# Patient Record
Sex: Male | Born: 1990 | Race: Black or African American | Hispanic: No | Marital: Single | Smoking: Current every day smoker
Health system: Southern US, Community
[De-identification: ages and names within clinical notes are randomized; demographics above are authoritative.]

## PROBLEM LIST (undated history)

## (undated) DIAGNOSIS — I10 Essential (primary) hypertension: Secondary | ICD-10-CM

## (undated) DIAGNOSIS — W3400XA Accidental discharge from unspecified firearms or gun, initial encounter: Secondary | ICD-10-CM

## (undated) DIAGNOSIS — R569 Unspecified convulsions: Principal | ICD-10-CM

## (undated) DIAGNOSIS — H547 Unspecified visual loss: Secondary | ICD-10-CM

## (undated) HISTORY — PX: ENUCLEATION: SHX628

---
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
## 2010-12-25 ENCOUNTER — Emergency Department (HOSPITAL_COMMUNITY)
Admission: EM | Admit: 2010-12-25 | Discharge: 2010-12-25 | Disposition: A | Discharge status: Home / Self Care | Payer: Self-pay | Attending: Emergency Medicine | Admitting: Emergency Medicine

## 2010-12-25 DIAGNOSIS — R300 Dysuria: Secondary | ICD-10-CM | POA: Insufficient documentation

## 2010-12-25 DIAGNOSIS — R109 Unspecified abdominal pain: Secondary | ICD-10-CM | POA: Insufficient documentation

## 2010-12-25 DIAGNOSIS — N390 Urinary tract infection, site not specified: Secondary | ICD-10-CM | POA: Insufficient documentation

## 2010-12-25 DIAGNOSIS — R369 Urethral discharge, unspecified: Secondary | ICD-10-CM | POA: Insufficient documentation

## 2010-12-25 LAB — URINALYSIS, ROUTINE W REFLEX MICROSCOPIC
Bilirubin Urine: NEGATIVE
Ketones, ur: NEGATIVE mg/dL
Nitrite: NEGATIVE
Protein, ur: 30 mg/dL — AB
Specific Gravity, Urine: 1.021 (ref 1.005–1.030)
Urine Glucose, Fasting: NEGATIVE mg/dL
Urobilinogen, UA: 1 mg/dL (ref 0.0–1.0)
pH: 6 (ref 5.0–8.0)

## 2010-12-25 LAB — URINE MICROSCOPIC-ADD ON

## 2010-12-28 LAB — GC/CHLAMYDIA PROBE AMP, GENITAL
Chlamydia, DNA Probe: NEGATIVE
GC Probe Amp, Genital: POSITIVE — AB

## 2011-02-28 LAB — MONONUCLEOSIS SCREEN: Mono Screen: POSITIVE — AB

## 2011-02-28 LAB — RAPID STREP SCREEN (MED CTR MEBANE ONLY): Streptococcus, Group A Screen (Direct): NEGATIVE

## 2011-02-28 LAB — STREP A DNA PROBE: Group A Strep Probe: NEGATIVE

## 2016-10-26 ENCOUNTER — Emergency Department (HOSPITAL_COMMUNITY)
Admission: EM | Admit: 2016-10-26 | Discharge: 2016-10-26 | Disposition: A | Discharge status: Other Acute Inpatient Hospital | Payer: Medicaid Other | Attending: Emergency Medicine | Admitting: Emergency Medicine

## 2016-10-26 ENCOUNTER — Encounter (HOSPITAL_COMMUNITY): Payer: Self-pay | Admitting: Emergency Medicine

## 2016-10-26 ENCOUNTER — Emergency Department (HOSPITAL_COMMUNITY): Payer: Medicaid Other

## 2016-10-26 DIAGNOSIS — S0193XA Puncture wound without foreign body of unspecified part of head, initial encounter: Secondary | ICD-10-CM

## 2016-10-26 DIAGNOSIS — Z5181 Encounter for therapeutic drug level monitoring: Secondary | ICD-10-CM | POA: Insufficient documentation

## 2016-10-26 DIAGNOSIS — S0993XA Unspecified injury of face, initial encounter: Secondary | ICD-10-CM | POA: Diagnosis not present

## 2016-10-26 DIAGNOSIS — W3400XA Accidental discharge from unspecified firearms or gun, initial encounter: Secondary | ICD-10-CM | POA: Insufficient documentation

## 2016-10-26 DIAGNOSIS — Y999 Unspecified external cause status: Secondary | ICD-10-CM | POA: Insufficient documentation

## 2016-10-26 DIAGNOSIS — S0180XA Unspecified open wound of other part of head, initial encounter: Secondary | ICD-10-CM | POA: Diagnosis present

## 2016-10-26 DIAGNOSIS — Y929 Unspecified place or not applicable: Secondary | ICD-10-CM | POA: Insufficient documentation

## 2016-10-26 DIAGNOSIS — Z79899 Other long term (current) drug therapy: Secondary | ICD-10-CM | POA: Insufficient documentation

## 2016-10-26 DIAGNOSIS — S0990XA Unspecified injury of head, initial encounter: Secondary | ICD-10-CM | POA: Diagnosis not present

## 2016-10-26 DIAGNOSIS — S0591XA Unspecified injury of right eye and orbit, initial encounter: Secondary | ICD-10-CM | POA: Insufficient documentation

## 2016-10-26 DIAGNOSIS — Y939 Activity, unspecified: Secondary | ICD-10-CM | POA: Insufficient documentation

## 2016-10-26 DIAGNOSIS — S0590XA Unspecified injury of unspecified eye and orbit, initial encounter: Secondary | ICD-10-CM

## 2016-10-26 LAB — PREPARE FRESH FROZEN PLASMA
UNIT DIVISION: 0
Unit division: 0

## 2016-10-26 LAB — COMPREHENSIVE METABOLIC PANEL
ALT: 19 U/L (ref 17–63)
ANION GAP: 10 (ref 5–15)
AST: 26 U/L (ref 15–41)
Albumin: 4.3 g/dL (ref 3.5–5.0)
Alkaline Phosphatase: 41 U/L (ref 38–126)
BUN: 13 mg/dL (ref 6–20)
CALCIUM: 9.1 mg/dL (ref 8.9–10.3)
CHLORIDE: 105 mmol/L (ref 101–111)
CO2: 23 mmol/L (ref 22–32)
Creatinine, Ser: 0.95 mg/dL (ref 0.61–1.24)
GFR, EST AFRICAN AMERICAN: 57 mL/min — AB (ref >60)
GFR, EST NON AFRICAN AMERICAN: 49 mL/min — AB (ref >60)
Glucose, Bld: 144 mg/dL — ABNORMAL HIGH (ref 65–99)
Potassium: 3.2 mmol/L — ABNORMAL LOW (ref 3.5–5.1)
SODIUM: 138 mmol/L (ref 135–145)
Total Bilirubin: 0.6 mg/dL (ref 0.3–1.2)
Total Protein: 7.4 g/dL (ref 6.5–8.1)

## 2016-10-26 LAB — CBC
HCT: 44.5 % (ref 39.0–52.0)
HEMOGLOBIN: 15.6 g/dL (ref 13.0–17.0)
MCH: 30.7 pg (ref 26.0–34.0)
MCHC: 35.1 g/dL (ref 30.0–36.0)
MCV: 87.6 fL (ref 78.0–100.0)
Platelets: 231 10*3/uL (ref 150–400)
RBC: 5.08 MIL/uL (ref 4.22–5.81)
RDW: 12.5 % (ref 11.5–15.5)
WBC: 8.7 10*3/uL (ref 4.0–10.5)

## 2016-10-26 LAB — I-STAT ARTERIAL BLOOD GAS, ED
Bicarbonate: 25.5 mmol/L (ref 20.0–28.0)
O2 Saturation: 100 %
PCO2 ART: 44.3 mmHg (ref 32.0–48.0)
PH ART: 7.369 (ref 7.350–7.450)
Patient temperature: 98.6
TCO2: 27 mmol/L (ref 0–100)
pO2, Arterial: 516 mmHg — ABNORMAL HIGH (ref 83.0–108.0)

## 2016-10-26 LAB — I-STAT CHEM 8, ED
BUN: 16 mg/dL (ref 6–20)
CALCIUM ION: 1.13 mmol/L — AB (ref 1.15–1.40)
Chloride: 104 mmol/L (ref 101–111)
Creatinine, Ser: 0.9 mg/dL (ref 0.61–1.24)
Glucose, Bld: 143 mg/dL — ABNORMAL HIGH (ref 65–99)
HEMATOCRIT: 45 % (ref 39.0–52.0)
Hemoglobin: 15.3 g/dL (ref 13.0–17.0)
Potassium: 3.3 mmol/L — ABNORMAL LOW (ref 3.5–5.1)
SODIUM: 141 mmol/L (ref 135–145)
TCO2: 25 mmol/L (ref 0–100)

## 2016-10-26 LAB — PROTIME-INR
INR: 1.15
Prothrombin Time: 14.8 seconds (ref 11.4–15.2)

## 2016-10-26 LAB — ETHANOL

## 2016-10-26 LAB — CDS SEROLOGY

## 2016-10-26 LAB — ABO/RH: ABO/RH(D): B POS

## 2016-10-26 MED ORDER — FENTANYL CITRATE (PF) 100 MCG/2ML IJ SOLN
INTRAMUSCULAR | Status: AC
  Filled 2016-10-26: qty 2

## 2016-10-26 MED ORDER — FENTANYL 2500MCG IN NS 250ML (10MCG/ML) PREMIX INFUSION: 100.0000 ug/h | INTRAVENOUS | Status: DC

## 2016-10-26 MED ORDER — VECURONIUM BROMIDE 10 MG IV SOLR
INTRAVENOUS | Status: AC | PRN
  Administered 2016-10-26 (×2): 10 mg via INTRAVENOUS

## 2016-10-26 MED ORDER — CEFAZOLIN SODIUM-DEXTROSE 2-4 GM/100ML-% IV SOLN
INTRAVENOUS | Status: AC
  Administered 2016-10-26: 14:00:00
  Filled 2016-10-26: qty 100

## 2016-10-26 MED ORDER — PROPOFOL 1000 MG/100ML IV EMUL
INTRAVENOUS | Status: AC | PRN
  Administered 2016-10-26: 40 ug/kg/min via INTRAVENOUS

## 2016-10-26 MED ORDER — STERILE WATER FOR INJECTION IJ SOLN
INTRAMUSCULAR | Status: AC
  Filled 2016-10-26: qty 10

## 2016-10-26 MED ORDER — PROPOFOL 10 MG/ML IV BOLUS
INTRAVENOUS | Status: AC | PRN
  Administered 2016-10-26: 60 mg via INTRAVENOUS

## 2016-10-26 MED ORDER — SUCCINYLCHOLINE CHLORIDE 20 MG/ML IJ SOLN
INTRAMUSCULAR | Status: AC | PRN
  Administered 2016-10-26: 100 mg via INTRAVENOUS

## 2016-10-26 MED ORDER — PROPOFOL 1000 MG/100ML IV EMUL
INTRAVENOUS | Status: AC
  Filled 2016-10-26: qty 100

## 2016-10-26 MED ORDER — ETOMIDATE 2 MG/ML IV SOLN
INTRAVENOUS | Status: AC | PRN
  Administered 2016-10-26: 20 mg via INTRAVENOUS

## 2016-10-26 MED ORDER — FENTANYL CITRATE (PF) 100 MCG/2ML IJ SOLN
INTRAMUSCULAR | Status: AC | PRN
  Administered 2016-10-26 (×2): 100 ug via INTRAVENOUS

## 2016-10-26 MED ORDER — IOPAMIDOL (ISOVUE-370) INJECTION 76%
INTRAVENOUS | Status: AC
  Administered 2016-10-26: 50 mL
  Filled 2016-10-26: qty 50

## 2016-10-26 MED ORDER — VECURONIUM BROMIDE 10 MG IV SOLR
INTRAVENOUS | Status: AC
  Filled 2016-10-26: qty 10

## 2016-10-26 MED ORDER — TETANUS-DIPHTH-ACELL PERTUSSIS 5-2.5-18.5 LF-MCG/0.5 IM SUSP
INTRAMUSCULAR | Status: AC
  Administered 2016-10-26: 0.5 mL via INTRAMUSCULAR
  Filled 2016-10-26: qty 0.5

## 2016-10-26 NOTE — ED Notes (Signed)
Family at bedside. 
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
## 2016-10-26 NOTE — Progress Notes (Signed)
Pt. Was transported to CT & back to trauma B without any complications.  
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
## 2016-10-26 NOTE — ED Notes (Signed)
Report given to Sudie BaileyKaren [facility], [facility] at bedside to transport
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
## 2016-10-26 NOTE — ED Notes (Signed)
Pt intubated with a 7.5 et tube good color change , x ray done at bedside to confirm
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
## 2016-10-27 DIAGNOSIS — S020XXA Fracture of vault of skull, initial encounter for closed fracture: Secondary | ICD-10-CM | POA: Insufficient documentation

## 2016-10-27 DIAGNOSIS — S41111A Laceration without foreign body of right upper arm, initial encounter: Secondary | ICD-10-CM | POA: Insufficient documentation

## 2016-10-27 DIAGNOSIS — S065X9A Traumatic subdural hemorrhage with loss of consciousness of unspecified duration, initial encounter: Secondary | ICD-10-CM | POA: Insufficient documentation

## 2016-10-27 DIAGNOSIS — S0531XA Ocular laceration without prolapse or loss of intraocular tissue, right eye, initial encounter: Secondary | ICD-10-CM | POA: Insufficient documentation

## 2016-10-27 LAB — TYPE AND SCREEN
BLOOD PRODUCT EXPIRATION DATE: 201712112359
BLOOD PRODUCT EXPIRATION DATE: 201712182359
Blood Product Expiration Date: 201712112359
Blood Product Expiration Date: 201712142359
Blood Product Expiration Date: 201712182359
Blood Product Expiration Date: 201712202359
ISSUE DATE / TIME: 201712071112
ISSUE DATE / TIME: 201712071112
UNIT TYPE AND RH: 7300
UNIT TYPE AND RH: 7300
UNIT TYPE AND RH: 7300
UNIT TYPE AND RH: 9500
Unit Type and Rh: 1700
Unit Type and Rh: 9500
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
## 2017-07-25 ENCOUNTER — Telehealth: Payer: Self-pay | Admitting: Nutrition

## 2017-07-30 NOTE — Telephone Encounter (Signed)
opended in error
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
## 2021-05-27 DIAGNOSIS — H540X55 Blindness right eye category 5, blindness left eye category 5: Secondary | ICD-10-CM | POA: Diagnosis not present

## 2021-05-27 DIAGNOSIS — H44522 Atrophy of globe, left eye: Secondary | ICD-10-CM | POA: Diagnosis not present

## 2021-05-27 DIAGNOSIS — Z9001 Acquired absence of eye: Secondary | ICD-10-CM | POA: Diagnosis not present

## 2022-01-05 DIAGNOSIS — H00021 Hordeolum internum right upper eyelid: Secondary | ICD-10-CM | POA: Diagnosis not present

## 2022-07-17 ENCOUNTER — Ambulatory Visit
Admission: EM | Admit: 2022-07-17 | Discharge: 2022-07-17 | Disposition: A | Discharge status: Home / Self Care | Payer: Medicaid Other | Attending: Physician Assistant | Admitting: Physician Assistant

## 2022-07-17 DIAGNOSIS — H6122 Impacted cerumen, left ear: Secondary | ICD-10-CM | POA: Diagnosis not present

## 2022-07-17 HISTORY — DX: Essential (primary) hypertension: I10

## 2022-07-17 MED ORDER — CARBAMIDE PEROXIDE 6.5 % OT SOLN: 5.0000 [drp] | Freq: Two times a day (BID) | OTIC | 0 refills | Status: AC

## 2022-07-17 NOTE — ED Triage Notes (Signed)
Pt c/o left ear feeling "cloudy." Onset last night. Denies pain.

## 2022-07-17 NOTE — ED Provider Notes (Signed)
[facility]    CSN: [id number] Arrival date & time: 07/17/22  6767      History   Chief Complaint Chief Complaint  Patient presents with   ear feels cloudy    HPI Gary Watson is a 31 y.o. male.   Patient here today for evaluation of left-sided ear congestion that started recently.  He reports that he tried to use a Q-tip this morning to try to clean ear and states this made symptoms worse.  He notes he did fly recently and is not sure if this is related.  He denies any fever.  He has not had any nasal congestion or sore throat.  The history is provided by the patient.    Past Medical History:  Diagnosis Date   Hypertension     There are no problems to display for this patient.   History reviewed. No pertinent surgical history.     Home Medications    Prior to Admission medications   Medication Sig Start Date End Date Taking? Authorizing Provider  carbamide peroxide (DEBROX) 6.5 % OTIC solution Place 5 drops into the left ear 2 (two) times daily for 5 days. 07/17/22 07/22/22 Yes Tomi Bamberger, PA-C    Family History History reviewed. No pertinent family history.  Social History Social History   Tobacco Use   Smoking status: Unknown     Allergies   Patient has no known allergies.   Review of Systems Review of Systems  Constitutional:  Negative for chills and fever.  HENT:  Positive for ear pain. Negative for congestion and sore throat.   Eyes:  Negative for discharge and redness.  Respiratory:  Negative for cough.   Gastrointestinal:  Negative for nausea and vomiting.  Neurological:  Negative for numbness.     Physical Exam Triage Vital Signs ED Triage Vitals [07/17/22 0832]  Enc Vitals Group     BP (!) 160/115     Pulse Rate 74     Resp 18     Temp 97.8 F (36.6 C)     Temp Source Oral     SpO2 97 %     Weight      Height      Head Circumference      Peak Flow      Pain Score 0     Pain Loc      Pain Edu?       Excl. in GC?    No data found.  Updated Vital Signs BP (!) 156/106   Pulse 74   Temp 97.8 F (36.6 C) (Oral)   Resp 18   SpO2 97%      Physical Exam Vitals and nursing note reviewed.  Constitutional:      General: He is not in acute distress.    Appearance: Normal appearance. He is not ill-appearing.  HENT:     Head: Normocephalic and atraumatic.     Ears:     Comments: Small amount of cerumen in right EAC, partially viewed TM WNL, Left EAC with cerumen impaction, TM not visualized    Nose: Nose normal. No congestion or rhinorrhea.  Eyes:     Conjunctiva/sclera: Conjunctivae normal.  Cardiovascular:     Rate and Rhythm: Normal rate.  Pulmonary:     Effort: Pulmonary effort is normal.  Neurological:     Mental Status: He is alert.  Psychiatric:        Mood and Affect: Mood normal.  Behavior: Behavior normal.        Thought Content: Thought content normal.      UC Treatments / Results  Labs (all labs ordered are listed, but only abnormal results are displayed) Labs Reviewed - No data to display  EKG   Radiology No results found.  Procedures Procedures (including critical care time)  Medications Ordered in UC Medications - No data to display  Initial Impression / Assessment and Plan / UC Course  I have reviewed the triage vital signs and the nursing notes.  Pertinent labs & imaging results that were available during my care of the patient were reviewed by me and considered in my medical decision making (see chart for details).    Ear irrigation in office somewhat successful as some wax was removed from left EAC. Will prescribe debrox drops for home use and encouraged follow up if symptoms do not improve with treatment.   Final Clinical Impressions(s) / UC Diagnoses   Final diagnoses:  Impacted cerumen of left ear   Discharge Instructions   None    ED Prescriptions     Medication Sig Dispense Auth. Provider   carbamide peroxide (DEBROX)  6.5 % OTIC solution Place 5 drops into the left ear 2 (two) times daily for 5 days. 15 mL Tomi Bamberger, PA-C      PDMP not reviewed this encounter.   Tomi Bamberger, PA-C 07/17/22 [phone]

## 2022-07-28 ENCOUNTER — Ambulatory Visit
Admission: EM | Admit: 2022-07-28 | Discharge: 2022-07-28 | Disposition: A | Discharge status: Home / Self Care | Payer: Medicaid Other

## 2022-07-28 DIAGNOSIS — H6122 Impacted cerumen, left ear: Secondary | ICD-10-CM

## 2022-07-28 NOTE — ED Triage Notes (Signed)
Pt  presents to uc with co of feeling like something is moving inside L ear recently seen here last week and given ear drops. Pt reports did not help.

## 2022-07-28 NOTE — ED Provider Notes (Signed)
[facility]    CSN: [id number] Arrival date & time: 07/28/22  1140      History   Chief Complaint Chief Complaint  Patient presents with   Otalgia    HPI NGOC DAUGHTRIDGE is a 31 y.o. male.   Patient here today for evaluation of left ear discomfort. He reports today he felt as if there is something moving in his ear. He was recently seen and treated for cerumen impaction and has been using drops as prescribed. He states these have not seemed helpful. He has not had fever. He denies any issues with his right ear. He has not had any congestion or cough.   The history is provided by the patient.    Past Medical History:  Diagnosis Date   Hypertension     There are no problems to display for this patient.   No past surgical history on file.     Home Medications    Prior to Admission medications   Not on File    Family History No family history on file.  Social History Social History   Tobacco Use   Smoking status: Unknown     Allergies   Patient has no known allergies.   Review of Systems Review of Systems  Constitutional:  Negative for chills and fever.  HENT:  Negative for congestion, ear pain and sore throat.   Eyes:  Negative for discharge and redness.  Respiratory:  Negative for cough and shortness of breath.   Gastrointestinal:  Negative for abdominal pain, nausea and vomiting.     Physical Exam Triage Vital Signs ED Triage Vitals  Enc Vitals Group     BP      Pulse      Resp      Temp      Temp src      SpO2      Weight      Height      Head Circumference      Peak Flow      Pain Score      Pain Loc      Pain Edu?      Excl. in GC?    No data found.  Updated Vital Signs BP 136/88   Pulse 93   Temp 97.8 F (36.6 C)   Resp 18   SpO2 95%      Physical Exam Vitals and nursing note reviewed.  Constitutional:      General: He is not in acute distress.    Appearance: Normal appearance. He is not ill-appearing.   HENT:     Head: Normocephalic and atraumatic.     Ears:     Comments: Cerumen noted in left EAC    Nose: Nose normal. No congestion.     Mouth/Throat:     Mouth: Mucous membranes are moist.     Pharynx: Oropharynx is clear. No oropharyngeal exudate or posterior oropharyngeal erythema.  Eyes:     Conjunctiva/sclera: Conjunctivae normal.  Cardiovascular:     Rate and Rhythm: Normal rate.  Pulmonary:     Effort: Pulmonary effort is normal. No respiratory distress.  Skin:    General: Skin is warm and dry.  Neurological:     Mental Status: He is alert.  Psychiatric:        Mood and Affect: Mood normal.        Thought Content: Thought content normal.      UC Treatments / Results  Labs (all labs  ordered are listed, but only abnormal results are displayed) Labs Reviewed - No data to display  EKG   Radiology No results found.  Procedures Procedures (including critical care time)  Medications Ordered in UC Medications - No data to display  Initial Impression / Assessment and Plan / UC Course  I have reviewed the triage vital signs and the nursing notes.  Pertinent labs & imaging results that were available during my care of the patient were reviewed by me and considered in my medical decision making (see chart for details).    Left ear irrigated in office-- cerumen removed successfully. No FB noted. Encouraged follow up with any further concerns.   Final Clinical Impressions(s) / UC Diagnoses   Final diagnoses:  Impacted cerumen of left ear   Discharge Instructions   None    ED Prescriptions   None    PDMP not reviewed this encounter.   Tomi Bamberger, PA-C 07/28/22 1243

## 2022-12-08 ENCOUNTER — Inpatient Hospital Stay (HOSPITAL_COMMUNITY)
Admission: EM | Admit: 2022-12-08 | Discharge: 2022-12-22 | DRG: 100 | Discharge status: Against Medical Advice | Payer: Medicaid Other | Attending: Internal Medicine | Admitting: Internal Medicine

## 2022-12-08 ENCOUNTER — Emergency Department (HOSPITAL_COMMUNITY): Payer: Medicaid Other

## 2022-12-08 ENCOUNTER — Encounter (HOSPITAL_COMMUNITY): Payer: Self-pay | Admitting: Pulmonary Disease

## 2022-12-08 ENCOUNTER — Inpatient Hospital Stay (HOSPITAL_COMMUNITY): Payer: Medicaid Other

## 2022-12-08 DIAGNOSIS — J690 Pneumonitis due to inhalation of food and vomit: Secondary | ICD-10-CM | POA: Diagnosis present

## 2022-12-08 DIAGNOSIS — F1220 Cannabis dependence, uncomplicated: Secondary | ICD-10-CM | POA: Diagnosis present

## 2022-12-08 DIAGNOSIS — R3129 Other microscopic hematuria: Secondary | ICD-10-CM | POA: Diagnosis not present

## 2022-12-08 DIAGNOSIS — M6282 Rhabdomyolysis: Secondary | ICD-10-CM | POA: Diagnosis present

## 2022-12-08 DIAGNOSIS — Z9911 Dependence on respirator [ventilator] status: Secondary | ICD-10-CM | POA: Diagnosis not present

## 2022-12-08 DIAGNOSIS — Z6838 Body mass index (BMI) 38.0-38.9, adult: Secondary | ICD-10-CM | POA: Diagnosis not present

## 2022-12-08 DIAGNOSIS — R000 Tachycardia, unspecified: Secondary | ICD-10-CM | POA: Diagnosis not present

## 2022-12-08 DIAGNOSIS — J9601 Acute respiratory failure with hypoxia: Secondary | ICD-10-CM | POA: Diagnosis not present

## 2022-12-08 DIAGNOSIS — H548 Legal blindness, as defined in USA: Secondary | ICD-10-CM | POA: Diagnosis present

## 2022-12-08 DIAGNOSIS — E669 Obesity, unspecified: Secondary | ICD-10-CM | POA: Diagnosis present

## 2022-12-08 DIAGNOSIS — R569 Unspecified convulsions: Secondary | ICD-10-CM | POA: Diagnosis not present

## 2022-12-08 DIAGNOSIS — G9389 Other specified disorders of brain: Secondary | ICD-10-CM | POA: Diagnosis present

## 2022-12-08 DIAGNOSIS — Z5329 Procedure and treatment not carried out because of patient's decision for other reasons: Secondary | ICD-10-CM | POA: Diagnosis not present

## 2022-12-08 DIAGNOSIS — R451 Restlessness and agitation: Secondary | ICD-10-CM | POA: Diagnosis present

## 2022-12-08 DIAGNOSIS — E871 Hypo-osmolality and hyponatremia: Secondary | ICD-10-CM | POA: Diagnosis present

## 2022-12-08 DIAGNOSIS — R109 Unspecified abdominal pain: Secondary | ICD-10-CM | POA: Diagnosis not present

## 2022-12-08 DIAGNOSIS — R739 Hyperglycemia, unspecified: Secondary | ICD-10-CM | POA: Diagnosis present

## 2022-12-08 DIAGNOSIS — R34 Anuria and oliguria: Secondary | ICD-10-CM | POA: Diagnosis not present

## 2022-12-08 DIAGNOSIS — E8721 Acute metabolic acidosis: Secondary | ICD-10-CM | POA: Diagnosis present

## 2022-12-08 DIAGNOSIS — R0902 Hypoxemia: Secondary | ICD-10-CM | POA: Diagnosis not present

## 2022-12-08 DIAGNOSIS — G40401 Other generalized epilepsy and epileptic syndromes, not intractable, with status epilepticus: Secondary | ICD-10-CM | POA: Diagnosis present

## 2022-12-08 DIAGNOSIS — R410 Disorientation, unspecified: Secondary | ICD-10-CM | POA: Diagnosis not present

## 2022-12-08 DIAGNOSIS — I160 Hypertensive urgency: Secondary | ICD-10-CM | POA: Diagnosis not present

## 2022-12-08 DIAGNOSIS — N179 Acute kidney failure, unspecified: Secondary | ICD-10-CM | POA: Diagnosis present

## 2022-12-08 DIAGNOSIS — R0689 Other abnormalities of breathing: Secondary | ICD-10-CM | POA: Diagnosis not present

## 2022-12-08 DIAGNOSIS — J9602 Acute respiratory failure with hypercapnia: Secondary | ICD-10-CM | POA: Diagnosis present

## 2022-12-08 DIAGNOSIS — Z79899 Other long term (current) drug therapy: Secondary | ICD-10-CM

## 2022-12-08 DIAGNOSIS — R4182 Altered mental status, unspecified: Secondary | ICD-10-CM | POA: Diagnosis not present

## 2022-12-08 DIAGNOSIS — I10 Essential (primary) hypertension: Secondary | ICD-10-CM | POA: Diagnosis present

## 2022-12-08 DIAGNOSIS — Z7151 Drug abuse counseling and surveillance of drug abuser: Secondary | ICD-10-CM

## 2022-12-08 DIAGNOSIS — E876 Hypokalemia: Secondary | ICD-10-CM | POA: Diagnosis present

## 2022-12-08 DIAGNOSIS — J9690 Respiratory failure, unspecified, unspecified whether with hypoxia or hypercapnia: Secondary | ICD-10-CM | POA: Diagnosis not present

## 2022-12-08 DIAGNOSIS — D649 Anemia, unspecified: Secondary | ICD-10-CM | POA: Diagnosis present

## 2022-12-08 DIAGNOSIS — R404 Transient alteration of awareness: Secondary | ICD-10-CM | POA: Diagnosis not present

## 2022-12-08 DIAGNOSIS — Z4682 Encounter for fitting and adjustment of non-vascular catheter: Secondary | ICD-10-CM | POA: Diagnosis not present

## 2022-12-08 DIAGNOSIS — N289 Disorder of kidney and ureter, unspecified: Secondary | ICD-10-CM | POA: Diagnosis not present

## 2022-12-08 DIAGNOSIS — Z4901 Encounter for fitting and adjustment of extracorporeal dialysis catheter: Secondary | ICD-10-CM | POA: Diagnosis not present

## 2022-12-08 DIAGNOSIS — J9811 Atelectasis: Secondary | ICD-10-CM | POA: Diagnosis not present

## 2022-12-08 HISTORY — DX: Unspecified visual loss: H54.7

## 2022-12-08 HISTORY — DX: Unspecified convulsions: R56.9

## 2022-12-08 HISTORY — DX: Accidental discharge from unspecified firearms or gun, initial encounter: W34.00XA

## 2022-12-08 LAB — HEMOGLOBIN A1C
Hgb A1c MFr Bld: 5.3 % (ref 4.8–5.6)
Mean Plasma Glucose: 105.41 mg/dL

## 2022-12-08 LAB — COMPREHENSIVE METABOLIC PANEL
ALT: 31 U/L (ref 0–44)
AST: 36 U/L (ref 15–41)
Albumin: 4.1 g/dL (ref 3.5–5.0)
Alkaline Phosphatase: 55 U/L (ref 38–126)
Anion gap: 22 — ABNORMAL HIGH (ref 5–15)
BUN: 12 mg/dL (ref 6–20)
CO2: 10 mmol/L — ABNORMAL LOW (ref 22–32)
Calcium: 8.6 mg/dL — ABNORMAL LOW (ref 8.9–10.3)
Chloride: 109 mmol/L (ref 98–111)
Creatinine, Ser: 1.47 mg/dL — ABNORMAL HIGH (ref 0.61–1.24)
GFR, Estimated: >60 mL/min (ref >60)
Glucose, Bld: 215 mg/dL — ABNORMAL HIGH (ref 70–99)
Potassium: 3.8 mmol/L (ref 3.5–5.1)
Sodium: 141 mmol/L (ref 135–145)
Total Bilirubin: 0.4 mg/dL (ref 0.3–1.2)
Total Protein: 8 g/dL (ref 6.5–8.1)

## 2022-12-08 LAB — I-STAT VENOUS BLOOD GAS, ED
Acid-base deficit: 20 mmol/L — ABNORMAL HIGH (ref 0.0–2.0)
Bicarbonate: 9.2 mmol/L — ABNORMAL LOW (ref 20.0–28.0)
Calcium, Ion: 1.1 mmol/L — ABNORMAL LOW (ref 1.15–1.40)
HCT: 50 % (ref 39.0–52.0)
Hemoglobin: 17 g/dL (ref 13.0–17.0)
O2 Saturation: 100 %
Potassium: 3.8 mmol/L (ref 3.5–5.1)
Sodium: 143 mmol/L (ref 135–145)
TCO2: 10 mmol/L — ABNORMAL LOW (ref 22–32)
pCO2, Ven: 31.5 mmHg — ABNORMAL LOW (ref 44–60)
pH, Ven: 7.074 — CL (ref 7.25–7.43)
pO2, Ven: 232 mmHg — ABNORMAL HIGH (ref 32–45)

## 2022-12-08 LAB — I-STAT ARTERIAL BLOOD GAS, ED
Acid-base deficit: 6 mmol/L — ABNORMAL HIGH (ref 0.0–2.0)
Bicarbonate: 20.6 mmol/L (ref 20.0–28.0)
Calcium, Ion: 1.22 mmol/L (ref 1.15–1.40)
HCT: 42 % (ref 39.0–52.0)
Hemoglobin: 14.3 g/dL (ref 13.0–17.0)
O2 Saturation: 100 %
Patient temperature: 98.6
Potassium: 4.4 mmol/L (ref 3.5–5.1)
Sodium: 143 mmol/L (ref 135–145)
TCO2: 22 mmol/L (ref 22–32)
pCO2 arterial: 45.1 mmHg (ref 32–48)
pH, Arterial: 7.267 — ABNORMAL LOW (ref 7.35–7.45)
pO2, Arterial: 479 mmHg — ABNORMAL HIGH (ref 83–108)

## 2022-12-08 LAB — CBC WITH DIFFERENTIAL/PLATELET
Abs Immature Granulocytes: 0.3 10*3/uL — ABNORMAL HIGH (ref 0.00–0.07)
Basophils Absolute: 0.1 10*3/uL (ref 0.0–0.1)
Basophils Relative: 0 %
Eosinophils Absolute: 0.1 10*3/uL (ref 0.0–0.5)
Eosinophils Relative: 0 %
HCT: 51.9 % (ref 39.0–52.0)
Hemoglobin: 16.3 g/dL (ref 13.0–17.0)
Immature Granulocytes: 2 %
Lymphocytes Relative: 32 %
Lymphs Abs: 5.1 10*3/uL — ABNORMAL HIGH (ref 0.7–4.0)
MCH: 29.7 pg (ref 26.0–34.0)
MCHC: 31.4 g/dL (ref 30.0–36.0)
MCV: 94.7 fL (ref 80.0–100.0)
Monocytes Absolute: 0.8 10*3/uL (ref 0.1–1.0)
Monocytes Relative: 5 %
Neutro Abs: 10 10*3/uL — ABNORMAL HIGH (ref 1.7–7.7)
Neutrophils Relative %: 61 %
Platelets: 299 10*3/uL (ref 150–400)
RBC: 5.48 MIL/uL (ref 4.22–5.81)
RDW: 12.8 % (ref 11.5–15.5)
WBC: 16.3 10*3/uL — ABNORMAL HIGH (ref 4.0–10.5)
nRBC: 0 % (ref 0.0–0.2)

## 2022-12-08 LAB — CBG MONITORING, ED: Glucose-Capillary: 129 mg/dL — ABNORMAL HIGH (ref 70–99)

## 2022-12-08 LAB — CK: Total CK: 179 U/L (ref 49–397)

## 2022-12-08 LAB — MRSA NEXT GEN BY PCR, NASAL: MRSA by PCR Next Gen: NOT DETECTED

## 2022-12-08 LAB — I-STAT CHEM 8, ED
BUN: 14 mg/dL (ref 6–20)
Calcium, Ion: 1.08 mmol/L — ABNORMAL LOW (ref 1.15–1.40)
Chloride: 112 mmol/L — ABNORMAL HIGH (ref 98–111)
Creatinine, Ser: 1.3 mg/dL — ABNORMAL HIGH (ref 0.61–1.24)
Glucose, Bld: 211 mg/dL — ABNORMAL HIGH (ref 70–99)
HCT: 51 % (ref 39.0–52.0)
Hemoglobin: 17.3 g/dL — ABNORMAL HIGH (ref 13.0–17.0)
Potassium: 3.8 mmol/L (ref 3.5–5.1)
Sodium: 145 mmol/L (ref 135–145)
TCO2: 12 mmol/L — ABNORMAL LOW (ref 22–32)

## 2022-12-08 LAB — GLUCOSE, CAPILLARY
Glucose-Capillary: 76 mg/dL (ref 70–99)
Glucose-Capillary: 91 mg/dL (ref 70–99)

## 2022-12-08 LAB — LACTIC ACID, PLASMA
Lactic Acid, Venous: >9 mmol/L (ref 0.5–1.9)
Lactic Acid, Venous: 3.5 mmol/L (ref 0.5–1.9)

## 2022-12-08 LAB — HIV ANTIBODY (ROUTINE TESTING W REFLEX): HIV Screen 4th Generation wRfx: NONREACTIVE

## 2022-12-08 MED ORDER — MIDAZOLAM HCL 2 MG/2ML IJ SOLN
INTRAMUSCULAR | Status: AC
  Administered 2022-12-08: 2 mg via INTRAVENOUS
  Filled 2022-12-08: qty 2

## 2022-12-08 MED ORDER — SUCCINYLCHOLINE CHLORIDE 20 MG/ML IJ SOLN
INTRAMUSCULAR | Status: DC | PRN
  Administered 2022-12-08: 150 mg via INTRAVENOUS

## 2022-12-08 MED ORDER — SODIUM CHLORIDE 0.9 % IV BOLUS
1000.0000 mL | Freq: Once | INTRAVENOUS | Status: AC
  Administered 2022-12-08: 1000 mL via INTRAVENOUS

## 2022-12-08 MED ORDER — HYDRALAZINE HCL 20 MG/ML IJ SOLN
10.0000 mg | INTRAMUSCULAR | Status: AC | PRN
  Filled 2022-12-08: qty 1

## 2022-12-08 MED ORDER — DOCUSATE SODIUM 100 MG PO CAPS: 100.0000 mg | ORAL_CAPSULE | Freq: Two times a day (BID) | ORAL | Status: DC | PRN

## 2022-12-08 MED ORDER — LORAZEPAM 2 MG/ML IJ SOLN
INTRAMUSCULAR | Status: AC
  Filled 2022-12-08: qty 1

## 2022-12-08 MED ORDER — DEXAMETHASONE SODIUM PHOSPHATE 10 MG/ML IJ SOLN
10.0000 mg | Freq: Four times a day (QID) | INTRAMUSCULAR | Status: DC
  Administered 2022-12-08 – 2022-12-09 (×5): 10 mg via INTRAVENOUS
  Filled 2022-12-08 (×8): qty 1

## 2022-12-08 MED ORDER — ORAL CARE MOUTH RINSE
15.0000 mL | OROMUCOSAL | Status: DC
  Administered 2022-12-08 – 2022-12-10 (×18): 15 mL via OROMUCOSAL

## 2022-12-08 MED ORDER — DEXTROSE 5 % IV SOLN
1000.0000 mg | Freq: Three times a day (TID) | INTRAVENOUS | Status: DC
  Administered 2022-12-09 (×2): 1000 mg via INTRAVENOUS
  Filled 2022-12-08 (×9): qty 20

## 2022-12-08 MED ORDER — ORAL CARE MOUTH RINSE: 15.0000 mL | OROMUCOSAL | Status: DC | PRN

## 2022-12-08 MED ORDER — FENTANYL CITRATE PF 50 MCG/ML IJ SOSY
50.0000 ug | PREFILLED_SYRINGE | Freq: Once | INTRAMUSCULAR | Status: AC
  Administered 2022-12-08: 50 ug via INTRAVENOUS

## 2022-12-08 MED ORDER — POLYETHYLENE GLYCOL 3350 17 G PO PACK: 17.0000 g | PACK | Freq: Every day | ORAL | Status: DC | PRN

## 2022-12-08 MED ORDER — ETOMIDATE 2 MG/ML IV SOLN
INTRAVENOUS | Status: DC | PRN
  Administered 2022-12-08: 30 mg via INTRAVENOUS

## 2022-12-08 MED ORDER — VANCOMYCIN HCL 1250 MG/250ML IV SOLN
1250.0000 mg | Freq: Two times a day (BID) | INTRAVENOUS | Status: DC
  Filled 2022-12-08: qty 250

## 2022-12-08 MED ORDER — LEVETIRACETAM IN NACL 500 MG/100ML IV SOLN
500.0000 mg | Freq: Two times a day (BID) | INTRAVENOUS | Status: DC
  Administered 2022-12-09 – 2022-12-10 (×3): 500 mg via INTRAVENOUS
  Filled 2022-12-08 (×3): qty 100

## 2022-12-08 MED ORDER — DOCUSATE SODIUM 50 MG/5ML PO LIQD
100.0000 mg | Freq: Two times a day (BID) | ORAL | Status: DC
  Administered 2022-12-08 – 2022-12-09 (×2): 100 mg
  Filled 2022-12-08 (×2): qty 10

## 2022-12-08 MED ORDER — VANCOMYCIN HCL 2000 MG/400ML IV SOLN
2000.0000 mg | Freq: Once | INTRAVENOUS | Status: AC
  Administered 2022-12-08: 2000 mg via INTRAVENOUS
  Filled 2022-12-08: qty 400

## 2022-12-08 MED ORDER — SODIUM CHLORIDE 0.9 % IV SOLN
4500.0000 mg | Freq: Once | INTRAVENOUS | Status: AC
  Administered 2022-12-08: 4500 mg via INTRAVENOUS
  Filled 2022-12-08: qty 45

## 2022-12-08 MED ORDER — MAGNESIUM SULFATE 2 GM/50ML IV SOLN
2.0000 g | Freq: Once | INTRAVENOUS | Status: AC
  Administered 2022-12-08: 2 g via INTRAVENOUS
  Filled 2022-12-08: qty 50

## 2022-12-08 MED ORDER — MIDAZOLAM HCL 2 MG/2ML IJ SOLN
1.0000 mg | INTRAMUSCULAR | Status: DC | PRN
  Administered 2022-12-08: 1 mg via INTRAVENOUS
  Administered 2022-12-08 – 2022-12-09 (×3): 2 mg via INTRAVENOUS
  Filled 2022-12-08 (×4): qty 2

## 2022-12-08 MED ORDER — LORAZEPAM 2 MG/ML IJ SOLN
2.0000 mg | Freq: Once | INTRAMUSCULAR | Status: AC
  Administered 2022-12-08: 2 mg via INTRAVENOUS

## 2022-12-08 MED ORDER — POLYETHYLENE GLYCOL 3350 17 G PO PACK
17.0000 g | PACK | Freq: Every day | ORAL | Status: DC
  Administered 2022-12-08 – 2022-12-09 (×2): 17 g
  Filled 2022-12-08 (×2): qty 1

## 2022-12-08 MED ORDER — PANTOPRAZOLE SODIUM 40 MG IV SOLR
40.0000 mg | Freq: Every day | INTRAVENOUS | Status: DC
  Administered 2022-12-08 – 2022-12-09 (×2): 40 mg via INTRAVENOUS
  Filled 2022-12-08 (×2): qty 10

## 2022-12-08 MED ORDER — FENTANYL CITRATE PF 50 MCG/ML IJ SOSY: 50.0000 ug | PREFILLED_SYRINGE | INTRAMUSCULAR | Status: DC | PRN

## 2022-12-08 MED ORDER — LACTATED RINGERS IV BOLUS
1000.0000 mL | Freq: Once | INTRAVENOUS | Status: AC
  Administered 2022-12-08: 1000 mL via INTRAVENOUS

## 2022-12-08 MED ORDER — FENTANYL BOLUS VIA INFUSION: 50.0000 ug | INTRAVENOUS | Status: DC | PRN

## 2022-12-08 MED ORDER — PROPOFOL 1000 MG/100ML IV EMUL
5.0000 ug/kg/min | INTRAVENOUS | Status: DC
  Administered 2022-12-08: 60 ug/kg/min via INTRAVENOUS
  Administered 2022-12-08: 75 ug/kg/min via INTRAVENOUS
  Filled 2022-12-08: qty 100

## 2022-12-08 MED ORDER — FENTANYL CITRATE PF 50 MCG/ML IJ SOSY
50.0000 ug | PREFILLED_SYRINGE | INTRAMUSCULAR | Status: DC | PRN
  Filled 2022-12-08: qty 4

## 2022-12-08 MED ORDER — LACTATED RINGERS IV SOLN: INTRAVENOUS | Status: DC

## 2022-12-08 MED ORDER — CHLORHEXIDINE GLUCONATE CLOTH 2 % EX PADS
6.0000 | MEDICATED_PAD | Freq: Every day | CUTANEOUS | Status: DC
  Administered 2022-12-08 – 2022-12-09 (×2): 6 via TOPICAL

## 2022-12-08 MED ORDER — PROPOFOL 1000 MG/100ML IV EMUL
0.0000 ug/kg/min | INTRAVENOUS | Status: DC
  Administered 2022-12-08 (×2): 50 ug/kg/min via INTRAVENOUS
  Administered 2022-12-09: 35 ug/kg/min via INTRAVENOUS
  Administered 2022-12-09 (×3): 50 ug/kg/min via INTRAVENOUS
  Administered 2022-12-09: 40 ug/kg/min via INTRAVENOUS
  Administered 2022-12-09: 50 ug/kg/min via INTRAVENOUS
  Filled 2022-12-08 (×3): qty 100
  Filled 2022-12-08: qty 200
  Filled 2022-12-08: qty 100
  Filled 2022-12-08: qty 200
  Filled 2022-12-08: qty 100

## 2022-12-08 MED ORDER — SODIUM CHLORIDE 0.9 % IV SOLN
2.0000 g | Freq: Two times a day (BID) | INTRAVENOUS | Status: DC
  Administered 2022-12-09: 2 g via INTRAVENOUS
  Filled 2022-12-08: qty 20

## 2022-12-08 MED ORDER — INSULIN ASPART 100 UNIT/ML IJ SOLN
0.0000 [IU] | INTRAMUSCULAR | Status: DC
  Administered 2022-12-09: 2 [IU] via SUBCUTANEOUS

## 2022-12-08 MED ORDER — HEPARIN SODIUM (PORCINE) 5000 UNIT/ML IJ SOLN
5000.0000 [IU] | Freq: Three times a day (TID) | INTRAMUSCULAR | Status: DC
  Administered 2022-12-08 – 2022-12-22 (×38): 5000 [IU] via SUBCUTANEOUS
  Filled 2022-12-08 (×38): qty 1

## 2022-12-08 MED ORDER — FENTANYL 2500MCG IN NS 250ML (10MCG/ML) PREMIX INFUSION
50.0000 ug/h | INTRAVENOUS | Status: DC
  Administered 2022-12-08: 50 ug/h via INTRAVENOUS
  Filled 2022-12-08: qty 250
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
## 2022-12-08 NOTE — ED Notes (Signed)
Called CT to ascertain when we could bring pt; per CT tech, unable to take pt at this time, presently scanning other pts; CT to notify this RN when scanner available

## 2022-12-08 NOTE — ED Triage Notes (Addendum)
Pt bib ems from home; called out for AMS; mom found pt thrashing, altered, diaphoretic, incontinent of urine; no seizure hx; pt uses marijuana daily (5-12 blunts per day); denies etoh or use of other drugs; pt confused but alert on arrival; en route, pot has witnessed seizure; 12.5 total of versed given pta; pt combative on arrival to ed; pt is blind

## 2022-12-08 NOTE — Progress Notes (Signed)
Pt transported on vent from [facility] to CT and back without any complications. RN at bedside, RT will monitor.  

## 2022-12-08 NOTE — Progress Notes (Signed)
eLink Physician-Brief Progress Note Patient Name: Gary Watson DOB: 01-17-1991 MRN: [id number]   Date of Service  12/08/2022  HPI/Events of Note  Patient admitted with new onset seizures, altered mental status of unclear etiology, acute hypoxemic / hypercapnic respiratory failure (unable to protect his airway), and acute kidney injury, aspiration pneumonia is also suspected. Work up in progress.  eICU Interventions  New Patient Evaluation.        Frederik Pear 12/08/2022, 8:48 PM

## 2022-12-08 NOTE — Progress Notes (Signed)
[address] Progress Note Patient Name: Gary Watson DOB: 05-Feb-1991 MRN: [id number]   Date of Service  12/08/2022  HPI/Events of Note  Patient with sub-optimal blood pressure control.  eICU Interventions  PRN iv Hydralazine ordered.        Kerry Kass Nyleah Mcginnis 12/08/2022, 10:56 PM

## 2022-12-08 NOTE — Progress Notes (Signed)
Pharmacy Antibiotic Note  Gary Watson is a 32 y.o. male admitted on 12/08/2022 presenting with seizures, s/p intubation and concern for CNS infection.  Pharmacy has been consulted for vancomycin and acyclovir dosing.  Ceftriaxone per MD  Plan: Vancomycin 2g IV x 1, then 1250 mg IV q 12h (target vancomycin trough 15-20) Acyclovir 10mg /kg IV q 8h IVF while on acyclovir - LR@100  Monitor renal function, Cx and clinical progression to narrow Vancomycin trough as needed  Height: 6\' 1"  (185.4 cm) Weight: 136 kg (299 lb 13.2 oz) IBW/kg (Calculated) : 79.9  Temp (24hrs), Avg:98.4 F (36.9 C), Min:98 F (36.7 C), Max:98.6 F (37 C)  Recent Labs  Lab 12/08/22 1525 12/08/22 1549  WBC 16.3*  --   CREATININE 1.47* 1.30*  LATICACIDVEN  --  >9.0*    Estimated Creatinine Clearance: 119.1 mL/min (A) (by C-G formula based on SCr of 1.3 mg/dL (H)).    No Known Allergies  Bertis Ruddy, PharmD, [facility] Clinical Pharmacist ED Pharmacist Phone # [phone] 12/08/2022 8:02 PM
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
## 2022-12-08 NOTE — Progress Notes (Signed)
RN informed she has given report. EEG tech brought machine to the floor and is awaiting patient arrival.

## 2022-12-08 NOTE — ED Notes (Signed)
Ankle restraints removed

## 2022-12-08 NOTE — ED Notes (Signed)
Family at bedside. 

## 2022-12-08 NOTE — Progress Notes (Signed)
LTM EEG hooked up and running - no initial skin breakdown - push button tested - [facility] monitoring.  

## 2022-12-08 NOTE — Progress Notes (Signed)
Patient has a bed ready in the ICU. Tech will hold off on LTM placement until patient is on the floor. Lorrin Goodell, MD notified.
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
## 2022-12-08 NOTE — Progress Notes (Signed)
Went to [facility].  Spoke with nurse.  She advised patient is on the way to CT.

## 2022-12-09 ENCOUNTER — Inpatient Hospital Stay (HOSPITAL_COMMUNITY): Payer: Medicaid Other

## 2022-12-09 DIAGNOSIS — R569 Unspecified convulsions: Secondary | ICD-10-CM | POA: Diagnosis not present

## 2022-12-09 DIAGNOSIS — J9601 Acute respiratory failure with hypoxia: Secondary | ICD-10-CM | POA: Diagnosis not present

## 2022-12-09 DIAGNOSIS — Z9911 Dependence on respirator [ventilator] status: Secondary | ICD-10-CM | POA: Diagnosis not present

## 2022-12-09 LAB — URINALYSIS, ROUTINE W REFLEX MICROSCOPIC
Bilirubin Urine: NEGATIVE
Glucose, UA: NEGATIVE mg/dL
Hgb urine dipstick: NEGATIVE
Ketones, ur: NEGATIVE mg/dL
Leukocytes,Ua: NEGATIVE
Nitrite: NEGATIVE
Protein, ur: 100 mg/dL — AB
Specific Gravity, Urine: 1.016 (ref 1.005–1.030)
pH: 5 (ref 5.0–8.0)

## 2022-12-09 LAB — BASIC METABOLIC PANEL
Anion gap: 10 (ref 5–15)
BUN: 18 mg/dL (ref 6–20)
CO2: 16 mmol/L — ABNORMAL LOW (ref 22–32)
Calcium: 8.3 mg/dL — ABNORMAL LOW (ref 8.9–10.3)
Chloride: 111 mmol/L (ref 98–111)
Creatinine, Ser: 2.65 mg/dL — ABNORMAL HIGH (ref 0.61–1.24)
GFR, Estimated: 32 mL/min — ABNORMAL LOW (ref >60)
Glucose, Bld: 114 mg/dL — ABNORMAL HIGH (ref 70–99)
Potassium: 4.4 mmol/L (ref 3.5–5.1)
Sodium: 137 mmol/L (ref 135–145)

## 2022-12-09 LAB — MENINGITIS/ENCEPHALITIS PANEL (CSF)

## 2022-12-09 LAB — RAPID URINE DRUG SCREEN, HOSP PERFORMED
Amphetamines: NOT DETECTED
Barbiturates: NOT DETECTED
Benzodiazepines: POSITIVE — AB
Cocaine: NOT DETECTED
Opiates: NOT DETECTED
Tetrahydrocannabinol: POSITIVE — AB

## 2022-12-09 LAB — CSF CELL COUNT WITH DIFFERENTIAL
RBC Count, CSF: 51 /mm3 — ABNORMAL HIGH
RBC Count, CSF: 4150 /mm3 — ABNORMAL HIGH
Tube #: 4
Tube #: 1
WBC, CSF: 1 /mm3 (ref 0–5)
WBC, CSF: 1 /mm3 (ref 0–5)

## 2022-12-09 LAB — PROTEIN AND GLUCOSE, CSF
Glucose, CSF: 76 mg/dL — ABNORMAL HIGH (ref 40–70)
Total  Protein, CSF: 25 mg/dL (ref 15–45)

## 2022-12-09 LAB — PHOSPHORUS: Phosphorus: 2.3 mg/dL — ABNORMAL LOW (ref 2.5–4.6)

## 2022-12-09 LAB — CBC
HCT: 42.8 % (ref 39.0–52.0)
Hemoglobin: 14.5 g/dL (ref 13.0–17.0)
MCH: 29.9 pg (ref 26.0–34.0)
MCHC: 33.9 g/dL (ref 30.0–36.0)
MCV: 88.2 fL (ref 80.0–100.0)
Platelets: 211 10*3/uL (ref 150–400)
RBC: 4.85 MIL/uL (ref 4.22–5.81)
RDW: 13.2 % (ref 11.5–15.5)
WBC: 9.7 10*3/uL (ref 4.0–10.5)
nRBC: 0 % (ref 0.0–0.2)

## 2022-12-09 LAB — GLUCOSE, CAPILLARY
Glucose-Capillary: 110 mg/dL — ABNORMAL HIGH (ref 70–99)
Glucose-Capillary: 111 mg/dL — ABNORMAL HIGH (ref 70–99)
Glucose-Capillary: 118 mg/dL — ABNORMAL HIGH (ref 70–99)
Glucose-Capillary: 108 mg/dL — ABNORMAL HIGH (ref 70–99)
Glucose-Capillary: 101 mg/dL — ABNORMAL HIGH (ref 70–99)
Glucose-Capillary: 125 mg/dL — ABNORMAL HIGH (ref 70–99)

## 2022-12-09 LAB — MAGNESIUM: Magnesium: 3 mg/dL — ABNORMAL HIGH (ref 1.7–2.4)

## 2022-12-09 LAB — CK
Total CK: 923 U/L — ABNORMAL HIGH (ref 49–397)
Total CK: 947 U/L — ABNORMAL HIGH (ref 49–397)

## 2022-12-09 LAB — LACTIC ACID, PLASMA: Lactic Acid, Venous: 0.9 mmol/L (ref 0.5–1.9)

## 2022-12-09 LAB — TRIGLYCERIDES: Triglycerides: 107 mg/dL (ref <150)

## 2022-12-09 MED ORDER — VITAL HIGH PROTEIN PO LIQD
1000.0000 mL | ORAL | Status: DC
  Administered 2022-12-09: 1000 mL

## 2022-12-09 MED ORDER — OSMOLITE 1.5 CAL PO LIQD
1000.0000 mL | ORAL | Status: DC
  Filled 2022-12-09 (×4): qty 1000

## 2022-12-09 MED ORDER — PROSOURCE TF20 ENFIT COMPATIBL EN LIQD
60.0000 mL | Freq: Every day | ENTERAL | Status: DC
  Administered 2022-12-09: 60 mL
  Filled 2022-12-09: qty 60

## 2022-12-09 MED ORDER — SODIUM CHLORIDE 0.9 % IV SOLN
2.0000 g | Freq: Two times a day (BID) | INTRAVENOUS | Status: DC
  Administered 2022-12-09: 2 g via INTRAVENOUS
  Filled 2022-12-09: qty 12.5

## 2022-12-09 MED ORDER — PROSOURCE TF20 ENFIT COMPATIBL EN LIQD
60.0000 mL | Freq: Two times a day (BID) | ENTERAL | Status: DC
  Filled 2022-12-09: qty 60

## 2022-12-09 MED ORDER — SODIUM CHLORIDE 0.9 % IV SOLN
2.0000 g | INTRAVENOUS | Status: AC
  Administered 2022-12-09 – 2022-12-11 (×3): 2 g via INTRAVENOUS
  Filled 2022-12-09 (×3): qty 20

## 2022-12-09 MED ORDER — SODIUM CHLORIDE 0.9 % IV SOLN
2.0000 g | INTRAVENOUS | Status: DC
  Administered 2022-12-09: 2 g via INTRAVENOUS
  Filled 2022-12-09 (×3): qty 2000

## 2022-12-09 MED ORDER — DEXMEDETOMIDINE HCL IN NACL 400 MCG/100ML IV SOLN
0.0000 ug/kg/h | INTRAVENOUS | Status: DC
  Administered 2022-12-09: 0.4 ug/kg/h via INTRAVENOUS
  Administered 2022-12-10: 0.5 ug/kg/h via INTRAVENOUS
  Administered 2022-12-10: 0.4 ug/kg/h via INTRAVENOUS
  Filled 2022-12-09 (×3): qty 100

## 2022-12-09 MED ORDER — VANCOMYCIN HCL 1500 MG/300ML IV SOLN
1500.0000 mg | INTRAVENOUS | Status: DC
  Filled 2022-12-09: qty 300

## 2022-12-09 MED ORDER — POTASSIUM & SODIUM PHOSPHATES 280-160-250 MG PO PACK
2.0000 | PACK | Freq: Once | ORAL | Status: AC
  Administered 2022-12-09: 2
  Filled 2022-12-09: qty 2
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
## 2022-12-09 NOTE — Procedures (Signed)
Extubation Procedure Note  Patient Details:   Name: Gary Watson DOB: 03-Sep-1991 MRN: [id number]   Airway Documentation:    Vent end date: 12/09/22 Vent end time: 1800   Evaluation  O2 sats: stable throughout Complications: No apparent complications Patient did tolerate procedure well. Bilateral Breath Sounds: Clear, Diminished   Yes, pt able to cough to clear secretions at this time. Placed on 3L nasal cannula and tolerating well at this time.   Virgilio Frees 12/09/2022, 6:04 PM
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
## 2022-12-09 NOTE — Procedures (Addendum)
Patient Name: YAHYE SIEBERT  MRN: [id number]  Epilepsy Attending: Lora Havens  Referring Physician/Provider: Derek Jack, MD  Duration: 12/08/2022 2117 to 12/09/2022 2117  Patient history: 32yo M with ams. EEG to evaluate for seizure.  Level of alertness: comatose  AEDs during EEG study: LEV, propofol  Technical aspects: This EEG study was done with scalp electrodes positioned according to the 10-20 International system of electrode placement. Electrical activity was reviewed with band pass filter of 1-70Hz , sensitivity of 7 uV/mm, display speed of 31mm/sec with a 60Hz  notched filter applied as appropriate. EEG data were recorded continuously and digitally stored.  Video monitoring was available and reviewed as appropriate.  Description: EEG showed continuous generalized 3 to 6 Hz theta-delta slowing with overriding 15 to 18 Hz beta activity distributed symmetrically and diffusely. Hyperventilation and photic stimulation were not performed.     ABNORMALITY - Continuous slow, generalized  IMPRESSION: This study is suggestive of severe diffuse encephalopathy, nonspecific etiology but likely related to sedation. No seizures or epileptiform discharges were seen throughout the recording.  Tzipora Mcinroy Barbra Sarks

## 2022-12-09 NOTE — Procedures (Signed)
Lumbar Puncture Procedure Note  ANGAS ISABELL  [id number]  [birth year]  Date:12/09/22  Time:12:26 PM   Provider Performing:Bonetta Mostek   Procedure: Lumbar Puncture (09983)  Indication(s) Rule out meningitis  Consent Risks of the procedure as well as the alternatives and risks of each were explained to the patient and/or caregiver.  Consent for the procedure was obtained and is signed in the bedside chart  Anesthesia Topical only with 1% lidocaine    Time Out Verified patient identification, verified procedure, site/side was marked, verified correct patient position, special equipment/implants available, medications/allergies/relevant history reviewed, required imaging and test results available.   Sterile Technique Maximal sterile technique including sterile barrier drape, hand hygiene, sterile gown, sterile gloves, mask, hair covering.    Procedure Description Using palpation, approximate location of L3-L4 space identified.   Lidocaine used to anesthetize skin and subcutaneous tissue overlying this area.  A 20g spinal needle was then used to access the subarachnoid space. Opening pressure: 27cm H2O. Closing pressure:Not obtained. Clear CSF obtained.  Complications/Tolerance None; patient tolerated the procedure well.   EBL Minimal   Specimen(s) CSF
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
## 2022-12-09 NOTE — Progress Notes (Addendum)
Very agitated, bucking the vent, localizing to his endotracheal tube.  Not specifically following commands.  Difficult assessment since he has bilateral enucleation to determine if he would be tracking or more responsive if he was less agitated by endotracheal tube.  His aspirating and SBT.  Planning to extubate this evening.  Multiple family members at bedside-- said he has followed commands for them.  Julian Hy, DO 12/09/22 5:54 PM [facility]   Extubated, talking, trying to get OOB. Very confused. Sister at bedside redirecting him. Mom updated in conference room. May need precedex overnight to help with agitation.  Julian Hy, DO 12/09/22 6:10 PM [facility]  For contact information, see Amion. If no response to pager, please call PCCM consult pager. After hours, 7PM- 7AM, please call Elink.
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
## 2022-12-10 DIAGNOSIS — N179 Acute kidney failure, unspecified: Secondary | ICD-10-CM

## 2022-12-10 DIAGNOSIS — R569 Unspecified convulsions: Secondary | ICD-10-CM | POA: Diagnosis not present

## 2022-12-10 LAB — CBC
HCT: 41.8 % (ref 39.0–52.0)
Hemoglobin: 14.1 g/dL (ref 13.0–17.0)
MCH: 29.9 pg (ref 26.0–34.0)
MCHC: 33.7 g/dL (ref 30.0–36.0)
MCV: 88.6 fL (ref 80.0–100.0)
Platelets: 188 10*3/uL (ref 150–400)
RBC: 4.72 MIL/uL (ref 4.22–5.81)
RDW: 13.2 % (ref 11.5–15.5)
WBC: 19.9 10*3/uL — ABNORMAL HIGH (ref 4.0–10.5)
nRBC: 0 % (ref 0.0–0.2)

## 2022-12-10 LAB — GLUCOSE, CAPILLARY
Glucose-Capillary: 111 mg/dL — ABNORMAL HIGH (ref 70–99)
Glucose-Capillary: 112 mg/dL — ABNORMAL HIGH (ref 70–99)
Glucose-Capillary: 129 mg/dL — ABNORMAL HIGH (ref 70–99)
Glucose-Capillary: 106 mg/dL — ABNORMAL HIGH (ref 70–99)
Glucose-Capillary: 95 mg/dL (ref 70–99)
Glucose-Capillary: 99 mg/dL (ref 70–99)

## 2022-12-10 LAB — BASIC METABOLIC PANEL
Anion gap: 12 (ref 5–15)
BUN: 34 mg/dL — ABNORMAL HIGH (ref 6–20)
CO2: 16 mmol/L — ABNORMAL LOW (ref 22–32)
Calcium: 8.3 mg/dL — ABNORMAL LOW (ref 8.9–10.3)
Chloride: 109 mmol/L (ref 98–111)
Creatinine, Ser: 5.41 mg/dL — ABNORMAL HIGH (ref 0.61–1.24)
GFR, Estimated: 14 mL/min — ABNORMAL LOW (ref >60)
Glucose, Bld: 115 mg/dL — ABNORMAL HIGH (ref 70–99)
Potassium: 4.5 mmol/L (ref 3.5–5.1)
Sodium: 137 mmol/L (ref 135–145)

## 2022-12-10 LAB — CK: Total CK: 2038 U/L — ABNORMAL HIGH (ref 49–397)

## 2022-12-10 LAB — PHOSPHORUS: Phosphorus: 5.7 mg/dL — ABNORMAL HIGH (ref 2.5–4.6)

## 2022-12-10 LAB — MAGNESIUM: Magnesium: 2.9 mg/dL — ABNORMAL HIGH (ref 1.7–2.4)

## 2022-12-10 MED ORDER — LACTATED RINGERS IV BOLUS
1000.0000 mL | Freq: Once | INTRAVENOUS | Status: AC
  Administered 2022-12-10: 1000 mL via INTRAVENOUS

## 2022-12-10 MED ORDER — LEVETIRACETAM 500 MG PO TABS
500.0000 mg | ORAL_TABLET | Freq: Two times a day (BID) | ORAL | Status: DC
  Administered 2022-12-10 – 2022-12-15 (×10): 500 mg via ORAL
  Filled 2022-12-10 (×11): qty 1

## 2022-12-10 MED ORDER — LABETALOL HCL 5 MG/ML IV SOLN
5.0000 mg | INTRAVENOUS | Status: DC | PRN
  Administered 2022-12-10 – 2022-12-17 (×14): 5 mg via INTRAVENOUS
  Filled 2022-12-10 (×15): qty 4

## 2022-12-10 MED ORDER — HYDRALAZINE HCL 20 MG/ML IJ SOLN
10.0000 mg | INTRAMUSCULAR | Status: AC | PRN
  Administered 2022-12-10 – 2022-12-11 (×4): 10 mg via INTRAVENOUS
  Filled 2022-12-10 (×4): qty 1

## 2022-12-10 MED ORDER — LACTATED RINGERS IV SOLN: INTRAVENOUS | Status: DC

## 2022-12-10 MED ORDER — AMLODIPINE BESYLATE 5 MG PO TABS
5.0000 mg | ORAL_TABLET | Freq: Every day | ORAL | Status: DC
  Administered 2022-12-10 – 2022-12-11 (×2): 5 mg via ORAL
  Filled 2022-12-10 (×2): qty 1

## 2022-12-10 NOTE — Progress Notes (Signed)
No UOP, 150cc on bladder scan. Additional 1L bolus.  Julian Hy, DO 12/10/22 2:58 PM [facility]

## 2022-12-10 NOTE — Progress Notes (Signed)
LTM EEG discontinued - no skin breakdown at unhook.  
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
## 2022-12-10 NOTE — Progress Notes (Signed)
[place] Progress Note Patient Name: Gary Watson DOB: September 01, 1991 MRN: [id number]   Date of Service  12/10/2022  HPI/Events of Note  BP 164/124 (137)   Had Hydralazine 10-20mg  q4 PRN ordered but the order expired  Cr 2.55 from 20th.  S/p extubated yesterday evening. Neg for meningitis-ID on board. Asp pneumonitis.    eICU Interventions  Hydralazine 10 mg q4 hr prn ordered for SBP > 160.    BP 164/124 (137)   Had Hydralazine 10-20mg  q4 PRN ordered but the order expired  Intervention Category Intermediate Interventions: Hypertension - evaluation and management  Elmer Sow 12/10/2022, 4:13 AM

## 2022-12-10 NOTE — Procedures (Addendum)
Patient Name: Gary Watson  MRN: [id number]  Epilepsy Attending: Lora Havens  Referring Physician/Provider: Derek Jack, MD  Duration: 12/09/2022 2117 to 12/10/2022 1037   Patient history: 32yo M with ams. EEG to evaluate for seizure.   Level of alertness: awake, asleep   AEDs during EEG study: LEV   Technical aspects: This EEG study was done with scalp electrodes positioned according to the 10-20 International system of electrode placement. Electrical activity was reviewed with band pass filter of 1-70Hz , sensitivity of 7 uV/mm, display speed of 62mm/sec with a 60Hz  notched filter applied as appropriate. EEG data were recorded continuously and digitally stored.  Video monitoring was available and reviewed as appropriate.   Description: No clear posterior dominant rhythm was seen. Sleep was characterized by vertex waves, sleep spindles (12 to 14 Hz), maximal frontocentral region. EEG showed continuous generalized 3 to 7 Hz theta-delta slowing. Hyperventilation and photic stimulation were not performed.      ABNORMALITY - Continuous slow, generalized   IMPRESSION: This study is suggestive of moderate to severe diffuse encephalopathy, nonspecific etiology. No seizures or epileptiform discharges were seen throughout the recording.   Gary Watson

## 2022-12-10 NOTE — Progress Notes (Signed)
Bladder scan 211cc at 1845. Patient stood at edge of bed to pee, states he does not feel the urge to go. No UOP >24 hours now.

## 2022-12-11 ENCOUNTER — Inpatient Hospital Stay (HOSPITAL_COMMUNITY): Payer: Medicaid Other

## 2022-12-11 DIAGNOSIS — M6282 Rhabdomyolysis: Secondary | ICD-10-CM

## 2022-12-11 DIAGNOSIS — N179 Acute kidney failure, unspecified: Secondary | ICD-10-CM | POA: Diagnosis not present

## 2022-12-11 DIAGNOSIS — E8721 Acute metabolic acidosis: Secondary | ICD-10-CM

## 2022-12-11 LAB — BASIC METABOLIC PANEL
Anion gap: 16 — ABNORMAL HIGH (ref 5–15)
BUN: 46 mg/dL — ABNORMAL HIGH (ref 6–20)
CO2: 15 mmol/L — ABNORMAL LOW (ref 22–32)
Calcium: 8.2 mg/dL — ABNORMAL LOW (ref 8.9–10.3)
Chloride: 107 mmol/L (ref 98–111)
Creatinine, Ser: 7.71 mg/dL — ABNORMAL HIGH (ref 0.61–1.24)
GFR, Estimated: 9 mL/min — ABNORMAL LOW (ref >60)
Glucose, Bld: 90 mg/dL (ref 70–99)
Potassium: 3.9 mmol/L (ref 3.5–5.1)
Sodium: 138 mmol/L (ref 135–145)

## 2022-12-11 LAB — GLUCOSE, CAPILLARY
Glucose-Capillary: 95 mg/dL (ref 70–99)
Glucose-Capillary: 109 mg/dL — ABNORMAL HIGH (ref 70–99)
Glucose-Capillary: 104 mg/dL — ABNORMAL HIGH (ref 70–99)

## 2022-12-11 LAB — CBC
HCT: 37.6 % — ABNORMAL LOW (ref 39.0–52.0)
Hemoglobin: 13.5 g/dL (ref 13.0–17.0)
MCH: 31.3 pg (ref 26.0–34.0)
MCHC: 35.9 g/dL (ref 30.0–36.0)
MCV: 87 fL (ref 80.0–100.0)
Platelets: 139 10*3/uL — ABNORMAL LOW (ref 150–400)
RBC: 4.32 MIL/uL (ref 4.22–5.81)
RDW: 13.3 % (ref 11.5–15.5)
WBC: 13.6 10*3/uL — ABNORMAL HIGH (ref 4.0–10.5)
nRBC: 0 % (ref 0.0–0.2)

## 2022-12-11 LAB — PHOSPHORUS: Phosphorus: 4.5 mg/dL (ref 2.5–4.6)

## 2022-12-11 LAB — MAGNESIUM: Magnesium: 2.6 mg/dL — ABNORMAL HIGH (ref 1.7–2.4)

## 2022-12-11 LAB — CK: Total CK: 5220 U/L — ABNORMAL HIGH (ref 49–397)

## 2022-12-11 MED ORDER — CARVEDILOL 3.125 MG PO TABS
3.1250 mg | ORAL_TABLET | Freq: Two times a day (BID) | ORAL | Status: DC
  Administered 2022-12-11 – 2022-12-12 (×2): 3.125 mg via ORAL
  Filled 2022-12-11 (×2): qty 1

## 2022-12-11 MED ORDER — AMLODIPINE BESYLATE 10 MG PO TABS
10.0000 mg | ORAL_TABLET | Freq: Every day | ORAL | Status: DC
  Administered 2022-12-12 – 2022-12-17 (×5): 10 mg via ORAL
  Filled 2022-12-11 (×6): qty 1

## 2022-12-11 MED ORDER — ACETAMINOPHEN 325 MG PO TABS
650.0000 mg | ORAL_TABLET | Freq: Four times a day (QID) | ORAL | Status: DC | PRN
  Administered 2022-12-11 – 2022-12-12 (×3): 650 mg via ORAL
  Filled 2022-12-11 (×3): qty 2

## 2022-12-11 MED ORDER — SODIUM BICARBONATE 8.4 % IV SOLN
INTRAVENOUS | Status: DC
  Filled 2022-12-11 (×3): qty 1000

## 2022-12-11 MED ORDER — SODIUM BICARBONATE 650 MG PO TABS
1300.0000 mg | ORAL_TABLET | Freq: Three times a day (TID) | ORAL | Status: DC
  Administered 2022-12-11: 1300 mg via ORAL
  Filled 2022-12-11: qty 2

## 2022-12-11 NOTE — Plan of Care (Signed)
  Problem: Education: Goal: Knowledge of General Education information will improve Description: Including pain rating scale, medication(s)/side effects and non-pharmacologic comfort measures Outcome: Progressing   Problem: Clinical Measurements: Goal: Respiratory complications will improve Outcome: Progressing   Problem: Safety: Goal: Verbalization of understanding the information provided will improve Outcome: Progressing   Problem: Self-Concept: Goal: Level of anxiety will decrease Outcome: Progressing

## 2022-12-11 NOTE — Progress Notes (Signed)
PT Cancellation Note  Patient Details Name: Gary Watson MRN: [id number] DOB: [birth year]   Cancelled Treatment:    Reason Eval/Treat Not Completed: PT screened, no needs identified, will sign off.  Pt ambulating independently per pt and family and no LOB. Pt has a few steps but pt and family are confident that pt will not have difficulties.  Pt appears to be at baseline per pt and family. Will sign off.     Alvira Philips 12/11/2022, 10:28 AM Mechelle Pates M,PT Acute Rehab Services [phone]

## 2022-12-11 NOTE — Progress Notes (Signed)
  Transition of Care [facility]) Screening Note   Patient Details  Name: Gary Watson Date of Birth: [birth year]   Transition of Care [facility]) CM/SW Contact:    Cyndi Bender, RN Phone Number: 12/11/2022, 2:27 PM    Transition of Care Department [facility]) has reviewed patient and no TOC needs have been identified at this time. We will continue to monitor patient advancement through interdisciplinary progression rounds. If new patient transition needs arise, please place a TOC consult.

## 2022-12-11 NOTE — Plan of Care (Signed)
  Problem: Safety: Goal: Violent Restraint(s) Outcome: Progressing   Problem: Safety: Goal: Non-violent Restraint(s) Outcome: Progressing   Problem: Education: Goal: Ability to describe self-care measures that may prevent or decrease complications (Diabetes Survival Skills Education) will improve Outcome: Progressing Goal: Individualized Educational Video(s) Outcome: Progressing   Problem: Coping: Goal: Ability to adjust to condition or change in health will improve Outcome: Progressing   Problem: Fluid Volume: Goal: Ability to maintain a balanced intake and output will improve Outcome: Progressing   Problem: Health Behavior/Discharge Planning: Goal: Ability to identify and utilize available resources and services will improve Outcome: Progressing Goal: Ability to manage health-related needs will improve Outcome: Progressing   Problem: Metabolic: Goal: Ability to maintain appropriate glucose levels will improve Outcome: Progressing   Problem: Nutritional: Goal: Maintenance of adequate nutrition will improve Outcome: Progressing Goal: Progress toward achieving an optimal weight will improve Outcome: Progressing   Problem: Skin Integrity: Goal: Risk for impaired skin integrity will decrease Outcome: Progressing   Problem: Tissue Perfusion: Goal: Adequacy of tissue perfusion will improve Outcome: Progressing   Problem: Education: Goal: Knowledge of General Education information will improve Description: Including pain rating scale, medication(s)/side effects and non-pharmacologic comfort measures Outcome: Progressing   Problem: Health Behavior/Discharge Planning: Goal: Ability to manage health-related needs will improve Outcome: Progressing   Problem: Clinical Measurements: Goal: Ability to maintain clinical measurements within normal limits will improve Outcome: Progressing Goal: Will remain free from infection Outcome: Progressing Goal: Diagnostic test  results will improve Outcome: Progressing Goal: Respiratory complications will improve Outcome: Progressing Goal: Cardiovascular complication will be avoided Outcome: Progressing   Problem: Activity: Goal: Risk for activity intolerance will decrease Outcome: Progressing   Problem: Nutrition: Goal: Adequate nutrition will be maintained Outcome: Progressing   Problem: Coping: Goal: Level of anxiety will decrease Outcome: Progressing   Problem: Elimination: Goal: Will not experience complications related to bowel motility Outcome: Progressing Goal: Will not experience complications related to urinary retention Outcome: Progressing   Problem: Pain Managment: Goal: General experience of comfort will improve Outcome: Progressing   Problem: Safety: Goal: Ability to remain free from injury will improve Outcome: Progressing   Problem: Skin Integrity: Goal: Risk for impaired skin integrity will decrease Outcome: Progressing   Problem: Education: Goal: Expressions of having a comfortable level of knowledge regarding the disease process will increase Outcome: Progressing   Problem: Coping: Goal: Ability to adjust to condition or change in health will improve Outcome: Progressing Goal: Ability to identify appropriate support needs will improve Outcome: Progressing   Problem: Health Behavior/Discharge Planning: Goal: Compliance with prescribed medication regimen will improve Outcome: Progressing   Problem: Medication: Goal: Risk for medication side effects will decrease Outcome: Progressing   Problem: Clinical Measurements: Goal: Complications related to the disease process, condition or treatment will be avoided or minimized Outcome: Progressing Goal: Diagnostic test results will improve Outcome: Progressing   Problem: Safety: Goal: Verbalization of understanding the information provided will improve Outcome: Progressing   Problem: Self-Concept: Goal: Level of  anxiety will decrease Outcome: Progressing Goal: Ability to verbalize feelings about condition will improve Outcome: Progressing

## 2022-12-11 NOTE — Progress Notes (Signed)
OT Cancellation Note  Patient Details Name: ALVIN DIFFEE MRN: [id number] DOB: 1991-05-28   Cancelled Treatment:    Reason Eval/Treat Not Completed: OT screened, no needs identified, will sign off Per pt, family and bedside RN, pt with no new concerns regarding ADLs/mobility. Pt has been getting up to bathroom without issue (limited only by baseline visual deficits). Please reconsult if OT needed.  Layla Maw 12/11/2022, 10:05 AM

## 2022-12-11 NOTE — Consult Note (Signed)
Nephrology Consult   Assessment/Recommendations:   AKI -suspecting secondary to rhabdo, likely evolved to ATN? No improvement in renal function with fluids. Will change fluids to bicarb based fluids -will check UA and renal ultrasound -no indications for HD at this junction but may be heading there if there is no improvement which I discussed briefly with patient and patient's mother -Avoid nephrotoxic medications including NSAIDs and iodinated intravenous contrast exposure unless the latter is absolutely indicated.  Preferred narcotic agents for pain control are hydromorphone, fentanyl, and methadone. Morphine should not be used. Avoid Baclofen and avoid oral sodium phosphate and magnesium citrate based laxatives / bowel preps. Continue strict Input and Output monitoring. Will monitor the patient closely with you and intervene or adjust therapy as indicated by changes in clinical status/labs   Status epilepticus -on keppra now  [place] -likely secondary to AKI. Lactic acidosis resolved -on PO bicarb, will stop this and convert to bicarb gtt @ 125cc/hr x 1 day and reassess  Rhabdomyolysis -CK uptrending, etiology unclear. Off propofol since 1/20 and has been seizure free. Bicarb based fluids as above  Hypertension: -can uptitrate amlodipine if needed +/- add beta blockers  AHRF -now extubated. Receiving rocephin for possible aspiration pneumonia   Recommendations conveyed to primary service.    [facility] 12/11/2022 8:51 AM   _____________________________________________________________________________________   History of Present Illness: Gary Watson is a/an 32 y.o. male with a past medical history of HTN, blindness secondary to [place] 2017, marijuana use who presents to [facility] with seizures. Required intubation for airway protection. LP neg for meningitis. S/p continued EEG. Maintained on keppra. Was receiving propofol which was stopped on 1/20. Currently  back to baseline. We are consulted for AKI, Cr 1.47 on presentation on 1/19, up to 6.4 on 1/21. Received LR boluses and mIVF however Cr went up further to 7.7. UOP charted is 0.2L however upon discussion with PCCM apparently I/O not accurate therefore UOP to be accurately measured now. Patient seen and examined in ICU, mother at bedside. Patient does complain of abdominal pain and did apparently receive miralax to help with his bowel movements. He does report that he is urinating. Otherwise denies any fevers, chills, chest pain, SOB,    Medications:  Current Facility-Administered Medications  Medication Dose Route Frequency Provider Last Rate Last Admin   amLODipine (NORVASC) tablet 5 mg  5 mg Oral Daily Noemi Chapel P, DO   5 mg at 12/10/22 1634   cefTRIAXone (ROCEPHIN) 2 g in sodium chloride 0.9 % 100 mL IVPB  2 g Intravenous Q24H Kaleen Mask, [place]   Stopped at 12/10/22 2334   Chlorhexidine Gluconate Cloth 2 % PADS 6 each  6 each Topical Daily Julian Hy, DO   6 each at 12/09/22 1733   dexmedetomidine (PRECEDEX) 400 MCG/100ML (4 mcg/mL) infusion  0-1.2 mcg/kg/hr Intravenous Titrated Julian Hy, DO   Stopped at 12/10/22 1424   heparin injection 5,000 Units  5,000 Units Subcutaneous Q8H Desai, Rahul P, PA-C   5,000 Units at 12/11/22 0973   insulin aspart (novoLOG) injection 0-15 Units  0-15 Units Subcutaneous Q4H Desai, Rahul P, PA-C   2 Units at 12/09/22 2355   labetalol (NORMODYNE) injection 5 mg  5 mg Intravenous Q2H PRN Noemi Chapel P, DO   5 mg at 12/10/22 5329   lactated ringers infusion   Intravenous Continuous Julian Hy, DO   Stopped at 12/11/22 0620   levETIRAcetam (KEPPRA) tablet 500 mg  500  mg Oral Q12H Kaleen Mask, RPH   500 mg at 12/10/22 1634   Oral care mouth rinse  15 mL Mouth Rinse PRN Noemi Chapel P, DO       sodium bicarbonate tablet 1,300 mg  1,300 mg Oral TID Julian Hy, DO         ALLERGIES Shellfish allergy  MEDICAL HISTORY Past Medical History:   Diagnosis Date   Blindness    Hypertension      SOCIAL HISTORY Social History   Socioeconomic History   Marital status: Single    Spouse name: Not on file   Number of children: Not on file   Years of education: Not on file   Highest education level: Not on file  Occupational History   Not on file  Tobacco Use   Smoking status: Unknown   Smokeless tobacco: Not on file  Substance and Sexual Activity   Alcohol use: Not on file   Drug use: Yes    Types: Marijuana   Sexual activity: Not on file  Other Topics Concern   Not on file  Social History Narrative   Not on file   Social Determinants of Health   Financial Resource Strain: Not on file  Food Insecurity: Not on file  Transportation Needs: Not on file  Physical Activity: Not on file  Stress: Not on file  Social Connections: Not on file  Intimate Partner Violence: Not on file     FAMILY HISTORY History reviewed. No pertinent family history.   No family history of kidney disease  Review of Systems: 12 systems reviewed Otherwise as per HPI, all other systems reviewed and negative  Physical Exam: Vitals:   12/11/22 0800 12/11/22 0828  BP: (!) 175/106   Pulse: 82   Resp: (!) 24   Temp:  98.1 F (36.7 C)  SpO2: 97%    Total I/O In: 360 [P.O.:360] Out: -   Intake/Output Summary (Last 24 hours) at 12/11/2022 0851 Last data filed at 12/11/2022 0630 Gross per 24 hour  Intake 5721.42 ml  Output 200 ml  Net 5521.42 ml   General: well-appearing, no acute distress, laying flat in bed HEENT: anicteric sclera, oropharynx clear without lesions CV: regular rate, normal rhythm, no murmurs, no gallops, no rubs Lungs: clear to auscultation bilaterally, normal work of breathing Abd: soft, non-tender, non-distended Skin: no visible lesions or rashes Ext: trace pedal edema b/l LEs Neuro: normal speech, no gross focal deficits, no tremors  Test Results Reviewed Lab Results  Component Value Date   NA 138  12/11/2022   K 3.9 12/11/2022   CL 107 12/11/2022   CO2 15 (L) 12/11/2022   BUN 46 (H) 12/11/2022   CREATININE 7.71 (H) 12/11/2022   CALCIUM 8.2 (L) 12/11/2022   ALBUMIN 4.1 12/08/2022   PHOS 4.5 12/11/2022     I have reviewed all relevant outside healthcare records related to the patient's kidney injury.
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
## 2022-12-11 NOTE — Progress Notes (Signed)
Received from [facility]M ICU in wheelchair accompanied by nurse. Oriented to room.

## 2022-12-12 ENCOUNTER — Encounter (HOSPITAL_COMMUNITY): Payer: Self-pay | Admitting: *Deleted

## 2022-12-12 ENCOUNTER — Other Ambulatory Visit: Payer: Self-pay

## 2022-12-12 DIAGNOSIS — R569 Unspecified convulsions: Secondary | ICD-10-CM | POA: Diagnosis not present

## 2022-12-12 LAB — COMPREHENSIVE METABOLIC PANEL
ALT: 40 U/L (ref 0–44)
AST: 80 U/L — ABNORMAL HIGH (ref 15–41)
Albumin: 3 g/dL — ABNORMAL LOW (ref 3.5–5.0)
Alkaline Phosphatase: 37 U/L — ABNORMAL LOW (ref 38–126)
Anion gap: 16 — ABNORMAL HIGH (ref 5–15)
BUN: 51 mg/dL — ABNORMAL HIGH (ref 6–20)
CO2: 17 mmol/L — ABNORMAL LOW (ref 22–32)
Calcium: 8 mg/dL — ABNORMAL LOW (ref 8.9–10.3)
Chloride: 100 mmol/L (ref 98–111)
Creatinine, Ser: 10.76 mg/dL — ABNORMAL HIGH (ref 0.61–1.24)
GFR, Estimated: 6 mL/min — ABNORMAL LOW (ref >60)
Glucose, Bld: 99 mg/dL (ref 70–99)
Potassium: 3.2 mmol/L — ABNORMAL LOW (ref 3.5–5.1)
Sodium: 133 mmol/L — ABNORMAL LOW (ref 135–145)
Total Bilirubin: 0.5 mg/dL (ref 0.3–1.2)
Total Protein: 6.1 g/dL — ABNORMAL LOW (ref 6.5–8.1)

## 2022-12-12 LAB — CBC
HCT: 37.4 % — ABNORMAL LOW (ref 39.0–52.0)
Hemoglobin: 12.7 g/dL — ABNORMAL LOW (ref 13.0–17.0)
MCH: 29.5 pg (ref 26.0–34.0)
MCHC: 34 g/dL (ref 30.0–36.0)
MCV: 86.8 fL (ref 80.0–100.0)
Platelets: 168 10*3/uL (ref 150–400)
RBC: 4.31 MIL/uL (ref 4.22–5.81)
RDW: 13 % (ref 11.5–15.5)
WBC: 7.3 10*3/uL (ref 4.0–10.5)
nRBC: 0 % (ref 0.0–0.2)

## 2022-12-12 LAB — CSF CULTURE W GRAM STAIN: Culture: NO GROWTH

## 2022-12-12 LAB — CK: Total CK: 7336 U/L — ABNORMAL HIGH (ref 49–397)

## 2022-12-12 LAB — PHOSPHORUS: Phosphorus: 6 mg/dL — ABNORMAL HIGH (ref 2.5–4.6)

## 2022-12-12 LAB — GLUCOSE, CAPILLARY
Glucose-Capillary: 117 mg/dL — ABNORMAL HIGH (ref 70–99)
Glucose-Capillary: 117 mg/dL — ABNORMAL HIGH (ref 70–99)
Glucose-Capillary: 124 mg/dL — ABNORMAL HIGH (ref 70–99)
Glucose-Capillary: 95 mg/dL (ref 70–99)
Glucose-Capillary: 99 mg/dL (ref 70–99)

## 2022-12-12 MED ORDER — TRAMADOL HCL 50 MG PO TABS
50.0000 mg | ORAL_TABLET | Freq: Two times a day (BID) | ORAL | Status: DC | PRN
  Administered 2022-12-12: 50 mg via ORAL
  Filled 2022-12-12: qty 1

## 2022-12-12 MED ORDER — SODIUM BICARBONATE 8.4 % IV SOLN
INTRAVENOUS | Status: DC
  Filled 2022-12-12 (×3): qty 1000

## 2022-12-12 MED ORDER — CARVEDILOL 6.25 MG PO TABS
6.2500 mg | ORAL_TABLET | Freq: Two times a day (BID) | ORAL | Status: DC
  Administered 2022-12-12 – 2022-12-15 (×6): 6.25 mg via ORAL
  Administered 2022-12-16: 12.5 mg via ORAL
  Filled 2022-12-12 (×6): qty 1

## 2022-12-12 MED ORDER — ISOSORB DINITRATE-HYDRALAZINE 20-37.5 MG PO TABS
1.0000 | ORAL_TABLET | Freq: Three times a day (TID) | ORAL | Status: DC
  Administered 2022-12-12 – 2022-12-15 (×10): 1 via ORAL
  Filled 2022-12-12 (×11): qty 1

## 2022-12-12 MED ORDER — OXYCODONE HCL 5 MG PO TABS
5.0000 mg | ORAL_TABLET | Freq: Four times a day (QID) | ORAL | Status: DC | PRN
  Administered 2022-12-12 – 2022-12-22 (×30): 5 mg via ORAL
  Filled 2022-12-12 (×31): qty 1

## 2022-12-12 MED ORDER — LOPERAMIDE HCL 2 MG PO CAPS: 2.0000 mg | ORAL_CAPSULE | ORAL | Status: DC | PRN

## 2022-12-12 NOTE — Progress Notes (Signed)
[facility] Progress Note    Assessment/ Plan:   AKI -suspecting secondary to rhabdo, likely evolved to ATN? No improvement in renal function with fluids. Will continue with bicarb based fluids. U/S neg for obstruction -Cr worsened to 10.76 today -no indications for HD at this junction based on exam (volume status acceptable, no uremic signs/symptoms) but may be heading there if there is no improvement which I discussed briefly with patient and patient's mother again today -Avoid nephrotoxic medications including NSAIDs and iodinated intravenous contrast exposure unless the latter is absolutely indicated.  Preferred narcotic agents for pain control are hydromorphone, fentanyl, and methadone. Morphine should not be used. Avoid Baclofen and avoid oral sodium phosphate and magnesium citrate based laxatives / bowel preps. Continue strict Input and Output monitoring. Will monitor the patient closely with you and intervene or adjust therapy as indicated by changes in clinical status/labs    Status epilepticus -on keppra now   [place] -likely secondary to AKI. Lactic acidosis resolved -c/w bicarb gtt @ 125cc/hr as above x 1 day and reassess   Rhabdomyolysis -CK continues to uptrend, etiology unclear--possibly related to keppra? Off propofol since 1/20 and has been seizure free. Bicarb based fluids as above. Would recommend switching keppra to an alternative med, discussed with primary service -will check lupus serologies   Hypertension: -can uptitrate amlodipine if needed +/- add beta blockers   AHRF -now extubated. Receiving rocephin for possible aspiration pneumonia  Subjective:   Patient seen and examined bedside. Out of ICU now. Mom and dad at bedside. He reports that he feels like he is making more urine. Denies any fevers, chills, chest pain, SOB, swelling, dysgeusia, loss of appetite, new shakes/tremors, brain fog. Mother at bedside reports that he may have a family  history of lupus in his grandmother but not severe.   Objective:   BP (!) 182/110 (BP Location: Right Arm)   Pulse 70   Temp 99.3 F (37.4 C) (Oral)   Resp 17   Ht 6\' 5"  (1.956 m)   Wt 134.7 kg   SpO2 97%   BMI 35.21 kg/m   Intake/Output Summary (Last 24 hours) at 12/12/2022 1208 Last data filed at 12/12/2022 6440 Gross per 24 hour  Intake 509.05 ml  Output 55 ml  Net 454.05 ml   Weight change: 0 kg  Physical Exam: Gen: NAD, laying flat in bed CVS: RRR Resp: CTA B/L Abd: soft, nt/nd Ext: no edema Neuro: awake, alert, no asterixis  Imaging: US RENAL  Result Date: 12/11/2022 CLINICAL DATA:  Acute kidney injury EXAM: RENAL / URINARY TRACT ULTRASOUND COMPLETE COMPARISON:  None Available. FINDINGS: Right Kidney: Length = 13.3 cm AP renal pelvis diameter = <10 mm Normal parenchymal echogenicity with preserved corticomedullary differentiation. No urinary tract dilation or shadowing calculi. The ureter is not seen. Nonshadowing ovoid echogenicity in the upper pole pelvis is likely artifactual related to echogenic renal sinus fat. Left Kidney: Length = 12.3 cm AP renal pelvis diameter = <10 mm Normal parenchymal echogenicity with preserved corticomedullary differentiation. No urinary tract dilation or shadowing calculi. The ureter is not seen. Bladder: Decompressed and not well seen. Other: None. IMPRESSION: No sonographic findings to suggest medical renal disease. No urinary tract dilation or shadowing calculi. Electronically Signed   By: Darrin Nipper M.D.   On: 12/11/2022 10:44    Labs: BMET Recent Labs  Lab 12/08/22 1525 12/08/22 1549 12/08/22 1552 12/08/22 1721 12/09/22 0558 12/10/22 0322 12/11/22 0146 12/12/22 0800  NA 141 145 143  143 137 137 138 133*  K 3.8 3.8 3.8 4.4 4.4 4.5 3.9 3.2*  CL 109 112*  --   --  111 109 107 100  CO2 10*  --   --   --  16* 16* 15* 17*  GLUCOSE 215* 211*  --   --  114* 115* 90 99  BUN 12 14  --   --  18 34* 46* 51*  CREATININE 1.47* 1.30*  --    --  2.65* 5.41* 7.71* 10.76*  CALCIUM 8.6*  --   --   --  8.3* 8.3* 8.2* 8.0*  PHOS  --   --   --   --  2.3* 5.7* 4.5 6.0*   CBC Recent Labs  Lab 12/08/22 1525 12/08/22 1549 12/09/22 0558 12/10/22 0322 12/11/22 0146 12/12/22 0800  WBC 16.3*  --  9.7 19.9* 13.6* 7.3  NEUTROABS 10.0*  --   --   --   --   --   HGB 16.3   < > 14.5 14.1 13.5 12.7*  HCT 51.9   < > 42.8 41.8 37.6* 37.4*  MCV 94.7  --  88.2 88.6 87.0 86.8  PLT 299  --  211 188 139* 168   < > = values in this interval not displayed.    Medications:     amLODipine  10 mg Oral Daily   carvedilol  6.25 mg Oral BID WC   heparin  5,000 Units Subcutaneous Q8H   isosorbide-hydrALAZINE  1 tablet Oral TID   levETIRAcetam  500 mg Oral Q12H      Gean Quint, MD [facility] 12/12/2022, 12:08 PM
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
## 2022-12-12 NOTE — TOC Initial Note (Signed)
Transition of Care [facility]) - Initial/Assessment Note    Patient Details  Name: Gary Watson MRN: [id number] Date of Birth: 1991-10-17  [facility] CM/SW Contact:    Pollie Friar, RN Phone Number: 12/12/2022, 2:23 PM  Clinical Narrative:                 Pt is from home with his mother. She is able to provide needed supervision at home. No DME. Pt has not been taking prescription medications at home. Family provides needed transportation. No PCP. CM offered to arrange but pts mother says she will get him an appointment after d/c. She wants him to go to her MD at [facility] on [facility]. TOC following.  Expected Discharge Plan: Home/Self Care Barriers to Discharge: Continued Medical Work up   Patient Goals and CMS Choice            Expected Discharge Plan and Services       Living arrangements for the past 2 months: Single Family Home                                      Prior Living Arrangements/Services Living arrangements for the past 2 months: Single Family Home Lives with:: Parents Patient language and need for interpreter reviewed:: Yes Do you feel safe going back to the place where you live?: Yes        Care giver support system in place?: Yes (comment)   Criminal Activity/Legal Involvement Pertinent to Current Situation/Hospitalization: No - Comment as needed  Activities of Daily Living Home Assistive Devices/Equipment: None ADL Screening (condition at time of admission) Patient's cognitive ability adequate to safely complete daily activities?: Yes Is the patient deaf or have difficulty hearing?: No Does the patient have difficulty seeing, even when wearing glasses/contacts?: Yes Does the patient have difficulty concentrating, remembering, or making decisions?: No Patient able to express need for assistance with ADLs?: Yes Does the patient have difficulty dressing or bathing?: No Independently performs ADLs?: Yes (appropriate for  developmental age) Does the patient have difficulty walking or climbing stairs?: No Weakness of Legs: None Weakness of Arms/Hands: None  Permission Sought/Granted                  Emotional Assessment Appearance:: Appears stated age Attitude/Demeanor/Rapport:  (fidgeting) Affect (typically observed): Frustrated Orientation: : Oriented to Self, Oriented to Place, Oriented to  Time, Oriented to Situation   Psych Involvement: No (comment)  Admission diagnosis:  Seizures ([place]) [R56.9] Patient Active Problem List   Diagnosis Date Noted   Non-traumatic rhabdomyolysis 12/11/2022   AKI (acute kidney injury) ([place]) 00/17/4944   Acute metabolic acidosis 96/75/9163   Seizures ([place]) 12/08/2022   PCP:  Patient, No Pcp Per Pharmacy:   [id number] - [place], [address] [place] [address] Phone: [phone] Fax: [phone]  [id number] - [place], [place] [address] Phone: [phone] Fax: [phone]     Social Determinants of Health (SDOH) Social History: SDOH Screenings   Food Insecurity: No Food Insecurity (12/12/2022)  Housing: Low Risk  (12/12/2022)  Transportation Needs: No Transportation Needs (12/12/2022)  Utilities: Not At Risk (12/12/2022)  Tobacco Use: High Risk (12/12/2022)   SDOH Interventions:     Readmission Risk Interventions     No data to display

## 2022-12-12 NOTE — Progress Notes (Signed)
Patient's BP was above 160 and was sustained, so gave him his PRN labetalol. Patient also had complain of lower back pain. So, his PRN Oxycodone oral

## 2022-12-12 NOTE — Plan of Care (Signed)
  Problem: Health Behavior/Discharge Planning: Goal: Ability to identify and utilize available resources and services will improve Outcome: Progressing   Problem: Nutritional: Goal: Maintenance of adequate nutrition will improve Outcome: Progressing   Problem: Skin Integrity: Goal: Risk for impaired skin integrity will decrease Outcome: Progressing   Problem: Coping: Goal: Ability to identify appropriate support needs will improve Outcome: Progressing   Problem: Clinical Measurements: Goal: Complications related to the disease process, condition or treatment will be avoided or minimized Outcome: Progressing

## 2022-12-12 NOTE — Progress Notes (Addendum)
   12/12/22  To Whom it may concern,  Gary Watson was hospitalized at [facility] on 12/11/2022. Given the serious nature of his illness, the presence of his father at the bedside has been needed. This letter is to confirm that his father was here in the hospital assisting with the care of his son on 12/11/22.   Should you have further questions please feel free to contact our office.  Be advised that no medical information will be divulged without a signed HIPA release from the patient.    Sincerely,  Cherene Altes, MD [facility] Office  [phone]

## 2022-12-13 DIAGNOSIS — N179 Acute kidney failure, unspecified: Secondary | ICD-10-CM | POA: Diagnosis not present

## 2022-12-13 DIAGNOSIS — E8721 Acute metabolic acidosis: Secondary | ICD-10-CM

## 2022-12-13 DIAGNOSIS — M6282 Rhabdomyolysis: Secondary | ICD-10-CM

## 2022-12-13 DIAGNOSIS — R569 Unspecified convulsions: Secondary | ICD-10-CM | POA: Diagnosis not present

## 2022-12-13 LAB — COMPREHENSIVE METABOLIC PANEL
ALT: 43 U/L (ref 0–44)
AST: 65 U/L — ABNORMAL HIGH (ref 15–41)
Albumin: 2.8 g/dL — ABNORMAL LOW (ref 3.5–5.0)
Alkaline Phosphatase: 35 U/L — ABNORMAL LOW (ref 38–126)
Anion gap: 16 — ABNORMAL HIGH (ref 5–15)
BUN: 57 mg/dL — ABNORMAL HIGH (ref 6–20)
CO2: 20 mmol/L — ABNORMAL LOW (ref 22–32)
Calcium: 7.8 mg/dL — ABNORMAL LOW (ref 8.9–10.3)
Chloride: 97 mmol/L — ABNORMAL LOW (ref 98–111)
Creatinine, Ser: 12.59 mg/dL — ABNORMAL HIGH (ref 0.61–1.24)
GFR, Estimated: 5 mL/min — ABNORMAL LOW (ref >60)
Glucose, Bld: 99 mg/dL (ref 70–99)
Potassium: 3.3 mmol/L — ABNORMAL LOW (ref 3.5–5.1)
Sodium: 133 mmol/L — ABNORMAL LOW (ref 135–145)
Total Bilirubin: 0.6 mg/dL (ref 0.3–1.2)
Total Protein: 6.1 g/dL — ABNORMAL LOW (ref 6.5–8.1)

## 2022-12-13 LAB — C4 COMPLEMENT: Complement C4, Body Fluid: 22 mg/dL (ref 12–38)

## 2022-12-13 LAB — CULTURE, BLOOD (ROUTINE X 2)
Culture: NO GROWTH
Culture: NO GROWTH
Special Requests: ADEQUATE
Special Requests: ADEQUATE

## 2022-12-13 LAB — CBC
HCT: 35.9 % — ABNORMAL LOW (ref 39.0–52.0)
Hemoglobin: 12.9 g/dL — ABNORMAL LOW (ref 13.0–17.0)
MCH: 30.1 pg (ref 26.0–34.0)
MCHC: 35.9 g/dL (ref 30.0–36.0)
MCV: 83.7 fL (ref 80.0–100.0)
Platelets: 172 10*3/uL (ref 150–400)
RBC: 4.29 MIL/uL (ref 4.22–5.81)
RDW: 12.8 % (ref 11.5–15.5)
WBC: 7.6 10*3/uL (ref 4.0–10.5)
nRBC: 0 % (ref 0.0–0.2)

## 2022-12-13 LAB — CK: Total CK: 4599 U/L — ABNORMAL HIGH (ref 49–397)

## 2022-12-13 LAB — GLUCOSE, CAPILLARY
Glucose-Capillary: 101 mg/dL — ABNORMAL HIGH (ref 70–99)
Glucose-Capillary: 112 mg/dL — ABNORMAL HIGH (ref 70–99)
Glucose-Capillary: 109 mg/dL — ABNORMAL HIGH (ref 70–99)
Glucose-Capillary: 107 mg/dL — ABNORMAL HIGH (ref 70–99)
Glucose-Capillary: 98 mg/dL (ref 70–99)
Glucose-Capillary: 117 mg/dL — ABNORMAL HIGH (ref 70–99)

## 2022-12-13 LAB — C3 COMPLEMENT: C3 Complement: 119 mg/dL (ref 82–167)

## 2022-12-13 LAB — ANA W/REFLEX IF POSITIVE: Anti Nuclear Antibody (ANA): NEGATIVE

## 2022-12-13 LAB — ANTI-DNA ANTIBODY, DOUBLE-STRANDED: ds DNA Ab: <1 IU/mL (ref 0–9)

## 2022-12-13 MED ORDER — SODIUM BICARBONATE 650 MG PO TABS
1300.0000 mg | ORAL_TABLET | Freq: Two times a day (BID) | ORAL | Status: DC
  Administered 2022-12-13 – 2022-12-14 (×3): 1300 mg via ORAL
  Filled 2022-12-13 (×3): qty 2

## 2022-12-13 MED ORDER — FUROSEMIDE 10 MG/ML IJ SOLN
80.0000 mg | Freq: Once | INTRAMUSCULAR | Status: AC
  Administered 2022-12-13: 80 mg via INTRAVENOUS
  Filled 2022-12-13: qty 8
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
## 2022-12-13 NOTE — Plan of Care (Signed)
  Problem: Safety: Goal: Violent Restraint(s) Outcome: Progressing   Problem: Safety: Goal: Non-violent Restraint(s) Outcome: Progressing   Problem: Education: Goal: Ability to describe self-care measures that may prevent or decrease complications (Diabetes Survival Skills Education) will improve Outcome: Progressing Goal: Individualized Educational Video(s) Outcome: Progressing   Problem: Coping: Goal: Ability to adjust to condition or change in health will improve Outcome: Progressing   Problem: Fluid Volume: Goal: Ability to maintain a balanced intake and output will improve Outcome: Progressing   Problem: Health Behavior/Discharge Planning: Goal: Ability to identify and utilize available resources and services will improve Outcome: Progressing Goal: Ability to manage health-related needs will improve Outcome: Progressing   Problem: Metabolic: Goal: Ability to maintain appropriate glucose levels will improve Outcome: Progressing   Problem: Nutritional: Goal: Maintenance of adequate nutrition will improve Outcome: Progressing Goal: Progress toward achieving an optimal weight will improve Outcome: Progressing   Problem: Skin Integrity: Goal: Risk for impaired skin integrity will decrease Outcome: Progressing   Problem: Tissue Perfusion: Goal: Adequacy of tissue perfusion will improve Outcome: Progressing   Problem: Education: Goal: Knowledge of General Education information will improve Description: Including pain rating scale, medication(s)/side effects and non-pharmacologic comfort measures Outcome: Progressing   Problem: Health Behavior/Discharge Planning: Goal: Ability to manage health-related needs will improve Outcome: Progressing   Problem: Clinical Measurements: Goal: Ability to maintain clinical measurements within normal limits will improve Outcome: Progressing Goal: Will remain free from infection Outcome: Progressing Goal: Diagnostic test  results will improve Outcome: Progressing Goal: Respiratory complications will improve Outcome: Progressing Goal: Cardiovascular complication will be avoided Outcome: Progressing   Problem: Activity: Goal: Risk for activity intolerance will decrease Outcome: Progressing   Problem: Nutrition: Goal: Adequate nutrition will be maintained Outcome: Progressing   Problem: Coping: Goal: Level of anxiety will decrease Outcome: Progressing   Problem: Elimination: Goal: Will not experience complications related to bowel motility Outcome: Progressing Goal: Will not experience complications related to urinary retention Outcome: Progressing   Problem: Pain Managment: Goal: General experience of comfort will improve Outcome: Progressing   Problem: Safety: Goal: Ability to remain free from injury will improve Outcome: Progressing   Problem: Skin Integrity: Goal: Risk for impaired skin integrity will decrease Outcome: Progressing   Problem: Education: Goal: Expressions of having a comfortable level of knowledge regarding the disease process will increase Outcome: Progressing   Problem: Coping: Goal: Ability to adjust to condition or change in health will improve Outcome: Progressing Goal: Ability to identify appropriate support needs will improve Outcome: Progressing   Problem: Health Behavior/Discharge Planning: Goal: Compliance with prescribed medication regimen will improve Outcome: Progressing   Problem: Medication: Goal: Risk for medication side effects will decrease Outcome: Progressing   Problem: Clinical Measurements: Goal: Complications related to the disease process, condition or treatment will be avoided or minimized Outcome: Progressing Goal: Diagnostic test results will improve Outcome: Progressing   Problem: Safety: Goal: Verbalization of understanding the information provided will improve Outcome: Progressing   Problem: Self-Concept: Goal: Level of  anxiety will decrease Outcome: Progressing Goal: Ability to verbalize feelings about condition will improve Outcome: Progressing   

## 2022-12-13 NOTE — Progress Notes (Signed)
[facility] Progress Note    Assessment/ Plan:   AKI -suspecting secondary to rhabdo, likely evolved to ATN? No improvement in renal function with fluids. U/S neg for obstruction -Cr worsened to 12.59. Stopping bicarb gtt. Bladder scan (discussed RN). Will do a lasix challenge today: 80mg  IV x 1 dose, will need strict I/O.  -no indications for HD at this junction based on exam (volume status acceptable, no uremic signs/symptoms) but may be heading there if there is no improvement which I discussed briefly with patient and patient's mother again today. If his kidney function does not turn around rather quick then I would recommend a kidney biopsy. I am still waiting on UA -Avoid nephrotoxic medications including NSAIDs and iodinated intravenous contrast exposure unless the latter is absolutely indicated.  Preferred narcotic agents for pain control are hydromorphone, fentanyl, and methadone. Morphine should not be used. Avoid Baclofen and avoid oral sodium phosphate and magnesium citrate based laxatives / bowel preps. Continue strict Input and Output monitoring. Will monitor the patient closely with you and intervene or adjust therapy as indicated by changes in clinical status/labs    Status epilepticus -on keppra now   [place] -likely secondary to AKI. Lactic acidosis resolved -transitioning from bicarb gtt to PO   Rhabdomyolysis -CK finally improved, etiology was unclear--possibly related to keppra? Off propofol since 1/20 and has been seizure free. Bicarb based fluids as above. Would recommend switching keppra to an alternative med if CK uptrends, discussed with primary service - lupus serologies pending   Hypertension: -improved, can increase coreg to 12.5mg  BID if needed and/or uptitrate bidil   AHRF -now extubated. Receiving rocephin for possible aspiration pneumonia  Subjective:   Patient seen and examined bedside. No acute events. Patient reports that he feels like he  has to pee more. Mother at bedside and turns out there is no family history of lupus after she discussed with family.   Objective:   BP (!) 140/88 (BP Location: Right Arm)   Pulse 75   Temp 98 F (36.7 C)   Resp 16   Ht 6\' 5"  (1.956 m)   Wt 134.7 kg   SpO2 99%   BMI 35.21 kg/m   Intake/Output Summary (Last 24 hours) at 12/13/2022 1008 Last data filed at 12/13/2022 0645 Gross per 24 hour  Intake 1922.79 ml  Output --  Net 1922.79 ml   Weight change: 0 kg  Physical Exam: Gen: NAD, laying flat in bed CVS: RRR Resp: CTA B/L Abd: soft, nt/nd Ext: no edema Neuro: awake, alert, no asterixis, blind  Imaging: US RENAL  Result Date: 12/11/2022 CLINICAL DATA:  Acute kidney injury EXAM: RENAL / URINARY TRACT ULTRASOUND COMPLETE COMPARISON:  None Available. FINDINGS: Right Kidney: Length = 13.3 cm AP renal pelvis diameter = <10 mm Normal parenchymal echogenicity with preserved corticomedullary differentiation. No urinary tract dilation or shadowing calculi. The ureter is not seen. Nonshadowing ovoid echogenicity in the upper pole pelvis is likely artifactual related to echogenic renal sinus fat. Left Kidney: Length = 12.3 cm AP renal pelvis diameter = <10 mm Normal parenchymal echogenicity with preserved corticomedullary differentiation. No urinary tract dilation or shadowing calculi. The ureter is not seen. Bladder: Decompressed and not well seen. Other: None. IMPRESSION: No sonographic findings to suggest medical renal disease. No urinary tract dilation or shadowing calculi. Electronically Signed   By: Darrin Nipper M.D.   On: 12/11/2022 10:44    Labs: BMET Recent Labs  Lab 12/08/22 1525 12/08/22 1549 12/08/22 1552  12/08/22 1721 12/09/22 0558 12/10/22 0322 12/11/22 0146 12/12/22 0800 12/13/22 0655  NA 141 145 143 143 137 137 138 133* 133*  K 3.8 3.8 3.8 4.4 4.4 4.5 3.9 3.2* 3.3*  CL 109 112*  --   --  111 109 107 100 97*  CO2 10*  --   --   --  16* 16* 15* 17* 20*  GLUCOSE 215*  211*  --   --  114* 115* 90 99 99  BUN 12 14  --   --  18 34* 46* 51* 57*  CREATININE 1.47* 1.30*  --   --  2.65* 5.41* 7.71* 10.76* 12.59*  CALCIUM 8.6*  --   --   --  8.3* 8.3* 8.2* 8.0* 7.8*  PHOS  --   --   --   --  2.3* 5.7* 4.5 6.0*  --    CBC Recent Labs  Lab 12/08/22 1525 12/08/22 1549 12/10/22 0322 12/11/22 0146 12/12/22 0800 12/13/22 0655  WBC 16.3*   < > 19.9* 13.6* 7.3 7.6  NEUTROABS 10.0*  --   --   --   --   --   HGB 16.3   < > 14.1 13.5 12.7* 12.9*  HCT 51.9   < > 41.8 37.6* 37.4* 35.9*  MCV 94.7   < > 88.6 87.0 86.8 83.7  PLT 299   < > 188 139* 168 172   < > = values in this interval not displayed.    Medications:     amLODipine  10 mg Oral Daily   carvedilol  6.25 mg Oral BID WC   furosemide  80 mg Intravenous Once   heparin  5,000 Units Subcutaneous Q8H   isosorbide-hydrALAZINE  1 tablet Oral TID   levETIRAcetam  500 mg Oral Q12H   sodium bicarbonate  1,300 mg Oral BID      Gean Quint, MD [facility] 12/13/2022, 10:08 AM

## 2022-12-14 ENCOUNTER — Inpatient Hospital Stay (HOSPITAL_COMMUNITY): Payer: Medicaid Other

## 2022-12-14 DIAGNOSIS — R569 Unspecified convulsions: Secondary | ICD-10-CM | POA: Diagnosis not present

## 2022-12-14 HISTORY — PX: IR US GUIDE VASC ACCESS RIGHT: IMG2390

## 2022-12-14 HISTORY — PX: IR FLUORO GUIDE CV LINE RIGHT: IMG2283

## 2022-12-14 LAB — COMPREHENSIVE METABOLIC PANEL
ALT: 42 U/L (ref 0–44)
AST: 53 U/L — ABNORMAL HIGH (ref 15–41)
Albumin: 2.8 g/dL — ABNORMAL LOW (ref 3.5–5.0)
Alkaline Phosphatase: 33 U/L — ABNORMAL LOW (ref 38–126)
Anion gap: 21 — ABNORMAL HIGH (ref 5–15)
BUN: 65 mg/dL — ABNORMAL HIGH (ref 6–20)
CO2: 17 mmol/L — ABNORMAL LOW (ref 22–32)
Calcium: 7.7 mg/dL — ABNORMAL LOW (ref 8.9–10.3)
Chloride: 91 mmol/L — ABNORMAL LOW (ref 98–111)
Creatinine, Ser: 13.81 mg/dL — ABNORMAL HIGH (ref 0.61–1.24)
GFR, Estimated: 4 mL/min — ABNORMAL LOW (ref >60)
Glucose, Bld: 92 mg/dL (ref 70–99)
Potassium: 3.5 mmol/L (ref 3.5–5.1)
Sodium: 129 mmol/L — ABNORMAL LOW (ref 135–145)
Total Bilirubin: 0.9 mg/dL (ref 0.3–1.2)
Total Protein: 6.1 g/dL — ABNORMAL LOW (ref 6.5–8.1)

## 2022-12-14 LAB — URINALYSIS, ROUTINE W REFLEX MICROSCOPIC
Bilirubin Urine: NEGATIVE
Glucose, UA: NEGATIVE mg/dL
Ketones, ur: NEGATIVE mg/dL
Nitrite: NEGATIVE
Protein, ur: 100 mg/dL — AB
Specific Gravity, Urine: 1.009 (ref 1.005–1.030)
pH: 6 (ref 5.0–8.0)

## 2022-12-14 LAB — GLUCOSE, CAPILLARY
Glucose-Capillary: 86 mg/dL (ref 70–99)
Glucose-Capillary: 111 mg/dL — ABNORMAL HIGH (ref 70–99)

## 2022-12-14 LAB — MAGNESIUM: Magnesium: 2 mg/dL (ref 1.7–2.4)

## 2022-12-14 LAB — CK: Total CK: 2939 U/L — ABNORMAL HIGH (ref 49–397)

## 2022-12-14 LAB — HEPATITIS B CORE ANTIBODY, TOTAL: Hep B Core Total Ab: NONREACTIVE

## 2022-12-14 LAB — HEPATITIS B SURFACE ANTIGEN: Hepatitis B Surface Ag: NONREACTIVE

## 2022-12-14 MED ORDER — CHLORHEXIDINE GLUCONATE CLOTH 2 % EX PADS
6.0000 | MEDICATED_PAD | Freq: Every day | CUTANEOUS | Status: DC
  Administered 2022-12-14 – 2022-12-21 (×8): 6 via TOPICAL

## 2022-12-14 MED ORDER — LIDOCAINE HCL 1 % IJ SOLN
INTRAMUSCULAR | Status: AC
  Administered 2022-12-14: 7 mL via INTRADERMAL
  Filled 2022-12-14: qty 20

## 2022-12-14 MED ORDER — ONDANSETRON HCL 4 MG/2ML IJ SOLN
4.0000 mg | Freq: Four times a day (QID) | INTRAMUSCULAR | Status: DC | PRN
  Administered 2022-12-14 – 2022-12-17 (×2): 4 mg via INTRAVENOUS
  Filled 2022-12-14 (×3): qty 2

## 2022-12-14 MED ORDER — HEPARIN SODIUM (PORCINE) 1000 UNIT/ML IJ SOLN
INTRAMUSCULAR | Status: AC
  Administered 2022-12-14: 2800 [IU]
  Filled 2022-12-14: qty 10

## 2022-12-14 NOTE — Plan of Care (Signed)
  Problem: Safety: Goal: Violent Restraint(s) Outcome: Progressing   Problem: Safety: Goal: Non-violent Restraint(s) Outcome: Progressing   Problem: Education: Goal: Ability to describe self-care measures that may prevent or decrease complications (Diabetes Survival Skills Education) will improve Outcome: Progressing Goal: Individualized Educational Video(s) Outcome: Progressing   Problem: Coping: Goal: Ability to adjust to condition or change in health will improve Outcome: Progressing   Problem: Fluid Volume: Goal: Ability to maintain a balanced intake and output will improve Outcome: Progressing   Problem: Health Behavior/Discharge Planning: Goal: Ability to identify and utilize available resources and services will improve Outcome: Progressing Goal: Ability to manage health-related needs will improve Outcome: Progressing   Problem: Metabolic: Goal: Ability to maintain appropriate glucose levels will improve Outcome: Progressing   Problem: Nutritional: Goal: Maintenance of adequate nutrition will improve Outcome: Progressing Goal: Progress toward achieving an optimal weight will improve Outcome: Progressing   Problem: Skin Integrity: Goal: Risk for impaired skin integrity will decrease Outcome: Progressing   Problem: Tissue Perfusion: Goal: Adequacy of tissue perfusion will improve Outcome: Progressing   Problem: Education: Goal: Knowledge of General Education information will improve Description: Including pain rating scale, medication(s)/side effects and non-pharmacologic comfort measures Outcome: Progressing   Problem: Health Behavior/Discharge Planning: Goal: Ability to manage health-related needs will improve Outcome: Progressing   Problem: Clinical Measurements: Goal: Ability to maintain clinical measurements within normal limits will improve Outcome: Progressing Goal: Will remain free from infection Outcome: Progressing Goal: Diagnostic test  results will improve Outcome: Progressing Goal: Respiratory complications will improve Outcome: Progressing Goal: Cardiovascular complication will be avoided Outcome: Progressing   Problem: Activity: Goal: Risk for activity intolerance will decrease Outcome: Progressing   Problem: Nutrition: Goal: Adequate nutrition will be maintained Outcome: Progressing   Problem: Coping: Goal: Level of anxiety will decrease Outcome: Progressing   Problem: Elimination: Goal: Will not experience complications related to bowel motility Outcome: Progressing Goal: Will not experience complications related to urinary retention Outcome: Progressing   Problem: Pain Managment: Goal: General experience of comfort will improve Outcome: Progressing   Problem: Safety: Goal: Ability to remain free from injury will improve Outcome: Progressing   Problem: Skin Integrity: Goal: Risk for impaired skin integrity will decrease Outcome: Progressing   Problem: Education: Goal: Expressions of having a comfortable level of knowledge regarding the disease process will increase Outcome: Progressing   Problem: Coping: Goal: Ability to adjust to condition or change in health will improve Outcome: Progressing Goal: Ability to identify appropriate support needs will improve Outcome: Progressing   Problem: Health Behavior/Discharge Planning: Goal: Compliance with prescribed medication regimen will improve Outcome: Progressing   Problem: Medication: Goal: Risk for medication side effects will decrease Outcome: Progressing   Problem: Clinical Measurements: Goal: Complications related to the disease process, condition or treatment will be avoided or minimized Outcome: Progressing Goal: Diagnostic test results will improve Outcome: Progressing   Problem: Safety: Goal: Verbalization of understanding the information provided will improve Outcome: Progressing   Problem: Self-Concept: Goal: Level of  anxiety will decrease Outcome: Progressing Goal: Ability to verbalize feelings about condition will improve Outcome: Progressing   

## 2022-12-14 NOTE — Progress Notes (Signed)
[facility] Progress Note    Assessment/ Plan:   AKI -suspecting secondary to rhabdo, likely evolved to ATN? No improvement in renal function with fluids. U/S neg for obstruction -Cr continues to worsen, now 13.8. UA still not done (had discussed this with staff yesterday), discussed again today, will be sent. I am not sure if there is another process happening. Seems like ATN given lack of response to fluids and lasix challenge on 1/24. -Lupus serologies negative. CK improving -Discussed in depth with patient's mother and patient at the bedside in regards to the need of renal replacement therapy at this junction. They are in agreement with proceeding forward. IR consulted. Will tentatively plan for IHD#1 today--slow start protocol -Avoid nephrotoxic medications including NSAIDs and iodinated intravenous contrast exposure unless the latter is absolutely indicated.  Preferred narcotic agents for pain control are hydromorphone, fentanyl, and methadone. Morphine should not be used. Avoid Baclofen and avoid oral sodium phosphate and magnesium citrate based laxatives / bowel preps. Continue strict Input and Output monitoring. Will monitor the patient closely with you and intervene or adjust therapy as indicated by changes in clinical status/labs    Status epilepticus -on keppra, has been seizure free   [place] -likely secondary to AKI. Lactic acidosis resolved. Bicarb down to 17, HD plan as above   Rhabdomyolysis -CK improving, etiology was unclear--possibly related to keppra? Off propofol since 1/20 and has been seizure free.  Would recommend switching keppra to an alternative med if CK uptrends, discussed with primary service - lupus serologies neg   Hypertension: -improved, can increase coreg to 12.5mg  BID if needed and/or uptitrate bidil   AHRF -now extubated. Off abx  Subjective:   Patient seen and examined bedside. Mother at bedside. Has not made any more urine despite  lasix yesterday. With loss of appetite. UA still not sent.   Objective:   BP (!) 166/111 (BP Location: Right Arm)   Pulse 74   Temp 97.7 F (36.5 C) (Oral)   Resp 18   Ht 6\' 5"  (1.956 m)   Wt (!) 156.5 kg   SpO2 95%   BMI 40.91 kg/m   Intake/Output Summary (Last 24 hours) at 12/14/2022 0943 Last data filed at 12/14/2022 0400 Gross per 24 hour  Intake 580 ml  Output 50 ml  Net 530 ml   Weight change: 21.8 kg  Physical Exam: Gen: NAD CVS: RRR Resp: CTA B/L Abd: soft, nt/nd Ext: no edema Neuro: awake, alert, blind  Imaging: No results found.  Labs: BMET Recent Labs  Lab 12/08/22 1525 12/08/22 1549 12/08/22 1552 12/08/22 1721 12/09/22 0558 12/10/22 0322 12/11/22 0146 12/12/22 0800 12/13/22 0655 12/14/22 0657  NA 141 145   < > 143 137 137 138 133* 133* 129*  K 3.8 3.8   < > 4.4 4.4 4.5 3.9 3.2* 3.3* 3.5  CL 109 112*  --   --  111 109 107 100 97* 91*  CO2 10*  --   --   --  16* 16* 15* 17* 20* 17*  GLUCOSE 215* 211*  --   --  114* 115* 90 99 99 92  BUN 12 14  --   --  18 34* 46* 51* 57* 65*  CREATININE 1.47* 1.30*  --   --  2.65* 5.41* 7.71* 10.76* 12.59* 13.81*  CALCIUM 8.6*  --   --   --  8.3* 8.3* 8.2* 8.0* 7.8* 7.7*  PHOS  --   --   --   --  2.3* 5.7* 4.5 6.0*  --   --    < > = values in this interval not displayed.   CBC Recent Labs  Lab 12/08/22 1525 12/08/22 1549 12/10/22 0322 12/11/22 0146 12/12/22 0800 12/13/22 0655  WBC 16.3*   < > 19.9* 13.6* 7.3 7.6  NEUTROABS 10.0*  --   --   --   --   --   HGB 16.3   < > 14.1 13.5 12.7* 12.9*  HCT 51.9   < > 41.8 37.6* 37.4* 35.9*  MCV 94.7   < > 88.6 87.0 86.8 83.7  PLT 299   < > 188 139* 168 172   < > = values in this interval not displayed.    Medications:     amLODipine  10 mg Oral Daily   carvedilol  6.25 mg Oral BID WC   Chlorhexidine Gluconate Cloth  6 each Topical Q0600   heparin  5,000 Units Subcutaneous Q8H   isosorbide-hydrALAZINE  1 tablet Oral TID   levETIRAcetam  500 mg Oral Q12H    sodium bicarbonate  1,300 mg Oral BID      Gean Quint, MD [facility] 12/14/2022, 9:43 AM
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
## 2022-12-14 NOTE — Procedures (Signed)
Pre-procedure Diagnosis: ESRD Post-procedure Diagnosis: Same  Successful placement of a non-tunneled HD catheter with tips terminating within the superior aspect of the right atrium.    Complications: None Immediate EBL: Trace   The catheter is ready for immediate use.   Jay Kacy Hegna, MD Pager #: [phone]   
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
## 2022-12-15 ENCOUNTER — Inpatient Hospital Stay (HOSPITAL_COMMUNITY): Payer: Medicaid Other

## 2022-12-15 DIAGNOSIS — E8721 Acute metabolic acidosis: Secondary | ICD-10-CM | POA: Diagnosis not present

## 2022-12-15 DIAGNOSIS — R569 Unspecified convulsions: Secondary | ICD-10-CM | POA: Diagnosis not present

## 2022-12-15 DIAGNOSIS — M6282 Rhabdomyolysis: Secondary | ICD-10-CM | POA: Diagnosis not present

## 2022-12-15 DIAGNOSIS — N179 Acute kidney failure, unspecified: Secondary | ICD-10-CM | POA: Diagnosis not present

## 2022-12-15 LAB — CBC
HCT: 33.5 % — ABNORMAL LOW (ref 39.0–52.0)
Hemoglobin: 11.6 g/dL — ABNORMAL LOW (ref 13.0–17.0)
MCH: 29.5 pg (ref 26.0–34.0)
MCHC: 34.6 g/dL (ref 30.0–36.0)
MCV: 85.2 fL (ref 80.0–100.0)
Platelets: 179 10*3/uL (ref 150–400)
RBC: 3.93 MIL/uL — ABNORMAL LOW (ref 4.22–5.81)
RDW: 12.6 % (ref 11.5–15.5)
WBC: 6.7 10*3/uL (ref 4.0–10.5)
nRBC: 0 % (ref 0.0–0.2)

## 2022-12-15 LAB — COMPREHENSIVE METABOLIC PANEL
ALT: 49 U/L — ABNORMAL HIGH (ref 0–44)
AST: 49 U/L — ABNORMAL HIGH (ref 15–41)
Albumin: 2.8 g/dL — ABNORMAL LOW (ref 3.5–5.0)
Alkaline Phosphatase: 42 U/L (ref 38–126)
Anion gap: 19 — ABNORMAL HIGH (ref 5–15)
BUN: 58 mg/dL — ABNORMAL HIGH (ref 6–20)
CO2: 20 mmol/L — ABNORMAL LOW (ref 22–32)
Calcium: 7.9 mg/dL — ABNORMAL LOW (ref 8.9–10.3)
Chloride: 89 mmol/L — ABNORMAL LOW (ref 98–111)
Creatinine, Ser: 13.27 mg/dL — ABNORMAL HIGH (ref 0.61–1.24)
GFR, Estimated: 5 mL/min — ABNORMAL LOW (ref >60)
Glucose, Bld: 83 mg/dL (ref 70–99)
Potassium: 3.3 mmol/L — ABNORMAL LOW (ref 3.5–5.1)
Sodium: 128 mmol/L — ABNORMAL LOW (ref 135–145)
Total Bilirubin: 0.8 mg/dL (ref 0.3–1.2)
Total Protein: 6.1 g/dL — ABNORMAL LOW (ref 6.5–8.1)

## 2022-12-15 LAB — CK: Total CK: 1689 U/L — ABNORMAL HIGH (ref 49–397)

## 2022-12-15 LAB — MAGNESIUM: Magnesium: 1.9 mg/dL (ref 1.7–2.4)

## 2022-12-15 MED ORDER — ALUM & MAG HYDROXIDE-SIMETH 200-200-20 MG/5ML PO SUSP
15.0000 mL | ORAL | Status: DC | PRN
  Administered 2022-12-15: 15 mL via ORAL
  Filled 2022-12-15 (×2): qty 30

## 2022-12-15 MED ORDER — HEPARIN SODIUM (PORCINE) 1000 UNIT/ML IJ SOLN
INTRAMUSCULAR | Status: AC
  Administered 2022-12-15: 2800 [IU]
  Filled 2022-12-15: qty 4

## 2022-12-15 MED ORDER — SENNOSIDES-DOCUSATE SODIUM 8.6-50 MG PO TABS
1.0000 | ORAL_TABLET | Freq: Two times a day (BID) | ORAL | Status: DC
  Filled 2022-12-15 (×6): qty 1

## 2022-12-15 MED ORDER — LEVETIRACETAM IN NACL 500 MG/100ML IV SOLN
500.0000 mg | Freq: Once | INTRAVENOUS | Status: AC
  Administered 2022-12-15: 500 mg via INTRAVENOUS
  Filled 2022-12-15: qty 100

## 2022-12-15 MED ORDER — RENA-VITE PO TABS
1.0000 | ORAL_TABLET | Freq: Every day | ORAL | Status: DC
  Administered 2022-12-15 – 2022-12-21 (×6): 1 via ORAL
  Filled 2022-12-15 (×7): qty 1

## 2022-12-15 MED ORDER — ENSURE ENLIVE PO LIQD
237.0000 mL | Freq: Two times a day (BID) | ORAL | Status: DC
  Administered 2022-12-18: 237 mL via ORAL

## 2022-12-15 MED ORDER — PROSOURCE PLUS PO LIQD
30.0000 mL | Freq: Two times a day (BID) | ORAL | Status: DC
  Filled 2022-12-15 (×2): qty 30

## 2022-12-15 NOTE — Plan of Care (Signed)
Patient has returned to unit from HD. Mom and a visitor were waiting bedside. Call bell within reach.    Problem: Coping: Goal: Ability to adjust to condition or change in health will improve Outcome: Progressing   Problem: Fluid Volume: Goal: Ability to maintain a balanced intake and output will improve Outcome: Progressing   Problem: Skin Integrity: Goal: Risk for impaired skin integrity will decrease Outcome: Progressing   Problem: Tissue Perfusion: Goal: Adequacy of tissue perfusion will improve Outcome: Progressing   Problem: Clinical Measurements: Goal: Diagnostic test results will improve Outcome: Progressing Goal: Respiratory complications will improve Outcome: Progressing Goal: Cardiovascular complication will be avoided Outcome: Progressing   Problem: Elimination: Goal: Will not experience complications related to bowel motility Outcome: Progressing Goal: Will not experience complications related to urinary retention Outcome: Progressing   Problem: Pain Managment: Goal: General experience of comfort will improve Outcome: Progressing   Problem: Safety: Goal: Ability to remain free from injury will improve Outcome: Progressing

## 2022-12-15 NOTE — Progress Notes (Signed)
   12/15/22 1100  Spiritual Encounters  Type of Visit Initial  Care provided to: Patient  Referral source Nurse (RN/NT/LPN)  Reason for visit Routine spiritual support  OnCall Visit No  Interventions  Spiritual Care Interventions Made Compassionate presence  Spiritual Care Plan  Spiritual Care Issues Still Outstanding No further spiritual care needs at this time (see row info)   Ch went so see patient, family was at bedside. Pt indicated he did not want to speak with [place] at this time. No follow up needed at this time.

## 2022-12-15 NOTE — Progress Notes (Signed)
[facility] Progress Note    Assessment/ Plan:   AKI -suspecting secondary to rhabdo, likely evolved to ATN? No improvement in renal function with fluids. U/S neg for obstruction. Failed fluids and lasix challenge. -Cr continued to worsen therefore started on RRT 1/25 after RIJ temp HD catheter placement with IR on 1/25. Will tentatively plan for IHD#2 today. Will reassess him tomorrow and decide if he will need a third treatment but I am anticipating this. -Based on UA 1/25, he does have microscopic hematuria (clean catch), will check ANCA and antiGBM. Given etiology of his rapidly deteriorating kidney function is unclear, would advocate for a renal biopsy sometime next week. May end up seeing acute tubular injury but will need to be certain. Discussed this at length with mom and patient and they are in agreement. IR consult placed for biopsy. -Lupus serologies negative. CK improving -Avoid nephrotoxic medications including NSAIDs and iodinated intravenous contrast exposure unless the latter is absolutely indicated.  Preferred narcotic agents for pain control are hydromorphone, fentanyl, and methadone. Morphine should not be used. Avoid Baclofen and avoid oral sodium phosphate and magnesium citrate based laxatives / bowel preps. Continue strict Input and Output monitoring. Will monitor the patient closely with you and intervene or adjust therapy as indicated by changes in clinical status/labs    Status epilepticus -on keppra, has been seizure free   AGMA -likely secondary to AKI. Managing with HD   Rhabdomyolysis -CK improving, etiology was unclear--possibly related to keppra? Off propofol since 1/20 and has been seizure free.  Would recommend switching keppra to an alternative med if CK uptrends, discussed with primary service - lupus serologies neg   Hypertension: -improved, can increase coreg to 12.5mg  BID if needed and/or uptitrate bidil   AHRF -now extubated. Off  abx  Hyponatremia -secondary to AKI, will manage with HD  Subjective:   Patient seen and examined bedside. Mother at bedside. Tolerated temp line placement and IHD#1 yesterday with net uf 1L. Stomach is still bothersome.   Objective:   BP (!) 177/108 (BP Location: Left Arm)   Pulse 86   Temp 99.4 F (37.4 C) (Oral)   Resp 18   Ht 6\' 5"  (1.956 m)   Wt (!) 157.7 kg   SpO2 92%   BMI 41.23 kg/m   Intake/Output Summary (Last 24 hours) at 12/15/2022 1038 Last data filed at 12/15/2022 0012 Gross per 24 hour  Intake --  Output 1000 ml  Net -1000 ml   Weight change: 2.7 kg  Physical Exam: Gen: NAD CVS: RRR Resp: CTA B/L Abd: soft, nt/nd Ext: no edema Neuro: awake, alert, blind Dialysis access: RIJ temp HD catheter  Imaging: DG Abd 1 View  Result Date: 12/15/2022 CLINICAL DATA:  Abdominal pain loss of appetite for several days EXAM: ABDOMEN - 1 VIEW COMPARISON:  12/08/2022 FINDINGS: The lung bases appear clear. Most of the bowel gas pattern is unremarkable, although there is a mildly dilated segment of small bowel in the left lower quadrant measuring up to 3.2 cm in diameter. No significant abnormal calcifications are identified. IMPRESSION: 1. Mildly dilated segment of small bowel in the left lower quadrant measuring up to 3.2 cm in diameter, nonspecific but localized ileus is a consideration. Electronically Signed   By: Van Clines M.D.   On: 12/15/2022 09:32   IR Fluoro Guide CV Line Right  Result Date: 12/15/2022 INDICATION: Acute renal insufficiency. Please perform image guided placement of temporary dialysis catheter for initiation of hemodialysis. EXAM: NON-TUNNELED  CENTRAL VENOUS HEMODIALYSIS CATHETER PLACEMENT WITH ULTRASOUND AND FLUOROSCOPIC GUIDANCE COMPARISON:  None Available. MEDICATIONS: None FLUOROSCOPY TIME:  1 minute, 6 seconds (72 mGy) COMPLICATIONS: None immediate. PROCEDURE: Informed written consent was obtained from the patient after a discussion of the  risks, benefits, and alternatives to treatment. Questions regarding the procedure were encouraged and answered. The right neck and chest were prepped with chlorhexidine in a sterile fashion, and a sterile drape was applied covering the operative field. Maximum barrier sterile technique with sterile gowns and gloves were used for the procedure. A timeout was performed prior to the initiation of the procedure. After the overlying soft tissues were anesthetized, a small venotomy incision was created and a micropuncture kit was utilized to access the internal jugular vein. Real-time ultrasound guidance was utilized for vascular access including the acquisition of a permanent ultrasound image documenting patency of the accessed vessel. The microwire was utilized to measure appropriate catheter length. A stiff glidewire was advanced to the level of the IVC. Under fluoroscopic guidance, the venotomy was serially dilated, ultimately allowing placement of a 20 cm temporary Mahurkar catheter with tip ultimately terminating within the superior aspect of the right atrium. Final catheter positioning was confirmed and documented with a spot radiographic image. The catheter aspirates and flushes normally. The catheter was flushed with appropriate volume heparin dwells. The catheter exit site was secured with a 0-Prolene retention suture. A dressing was placed. The patient tolerated the procedure well without immediate post procedural complication. IMPRESSION: Successful placement of a right internal jugular approach 20 cm temporary dialysis catheter with tip terminating with in the superior aspect of the right atrium. The catheter is ready for immediate use. PLAN: This catheter may be converted to a tunneled dialysis catheter at a later date as indicated. Electronically Signed   By: Simonne Come M.D.   On: 12/15/2022 08:28   IR US Guide Vasc Access Right  Result Date: 12/15/2022 INDICATION: Acute renal insufficiency. Please  perform image guided placement of temporary dialysis catheter for initiation of hemodialysis. EXAM: NON-TUNNELED CENTRAL VENOUS HEMODIALYSIS CATHETER PLACEMENT WITH ULTRASOUND AND FLUOROSCOPIC GUIDANCE COMPARISON:  None Available. MEDICATIONS: None FLUOROSCOPY TIME:  1 minute, 6 seconds (72 mGy) COMPLICATIONS: None immediate. PROCEDURE: Informed written consent was obtained from the patient after a discussion of the risks, benefits, and alternatives to treatment. Questions regarding the procedure were encouraged and answered. The right neck and chest were prepped with chlorhexidine in a sterile fashion, and a sterile drape was applied covering the operative field. Maximum barrier sterile technique with sterile gowns and gloves were used for the procedure. A timeout was performed prior to the initiation of the procedure. After the overlying soft tissues were anesthetized, a small venotomy incision was created and a micropuncture kit was utilized to access the internal jugular vein. Real-time ultrasound guidance was utilized for vascular access including the acquisition of a permanent ultrasound image documenting patency of the accessed vessel. The microwire was utilized to measure appropriate catheter length. A stiff glidewire was advanced to the level of the IVC. Under fluoroscopic guidance, the venotomy was serially dilated, ultimately allowing placement of a 20 cm temporary Mahurkar catheter with tip ultimately terminating within the superior aspect of the right atrium. Final catheter positioning was confirmed and documented with a spot radiographic image. The catheter aspirates and flushes normally. The catheter was flushed with appropriate volume heparin dwells. The catheter exit site was secured with a 0-Prolene retention suture. A dressing was placed. The patient tolerated the procedure well  without immediate post procedural complication. IMPRESSION: Successful placement of a right internal jugular approach 20  cm temporary dialysis catheter with tip terminating with in the superior aspect of the right atrium. The catheter is ready for immediate use. PLAN: This catheter may be converted to a tunneled dialysis catheter at a later date as indicated. Electronically Signed   By: Sandi Mariscal M.D.   On: 12/15/2022 08:28    Labs: BMET Recent Labs  Lab 12/09/22 0558 12/10/22 0322 12/11/22 0146 12/12/22 0800 12/13/22 0655 12/14/22 0657 12/15/22 0746  NA 137 137 138 133* 133* 129* 128*  K 4.4 4.5 3.9 3.2* 3.3* 3.5 3.3*  CL 111 109 107 100 97* 91* 89*  CO2 16* 16* 15* 17* 20* 17* 20*  GLUCOSE 114* 115* 90 99 99 92 83  BUN 18 34* 46* 51* 57* 65* 58*  CREATININE 2.65* 5.41* 7.71* 10.76* 12.59* 13.81* 13.27*  CALCIUM 8.3* 8.3* 8.2* 8.0* 7.8* 7.7* 7.9*  PHOS 2.3* 5.7* 4.5 6.0*  --   --   --    CBC Recent Labs  Lab 12/08/22 1525 12/08/22 1549 12/10/22 0322 12/11/22 0146 12/12/22 0800 12/13/22 0655  WBC 16.3*   < > 19.9* 13.6* 7.3 7.6  NEUTROABS 10.0*  --   --   --   --   --   HGB 16.3   < > 14.1 13.5 12.7* 12.9*  HCT 51.9   < > 41.8 37.6* 37.4* 35.9*  MCV 94.7   < > 88.6 87.0 86.8 83.7  PLT 299   < > 188 139* 168 172   < > = values in this interval not displayed.    Medications:     (feeding supplement) PROSource Plus  30 mL Oral BID BM   amLODipine  10 mg Oral Daily   carvedilol  6.25 mg Oral BID WC   Chlorhexidine Gluconate Cloth  6 each Topical Q0600   feeding supplement  237 mL Oral BID BM   heparin  5,000 Units Subcutaneous Q8H   isosorbide-hydrALAZINE  1 tablet Oral TID   levETIRAcetam  500 mg Oral Q12H   multivitamin  1 tablet Oral QHS   senna-docusate  1 tablet Oral BID   sodium bicarbonate  1,300 mg Oral BID      Gean Quint, MD [facility] 12/15/2022, 10:38 AM

## 2022-12-15 NOTE — Progress Notes (Signed)
Report provided to [facility] in HD.

## 2022-12-15 NOTE — Progress Notes (Signed)
BP remains elevated 198/111 despite PRN dose of Labetalol. Still complaining of gastric discomfort. Refusing food. Attending hospitalist notified.

## 2022-12-15 NOTE — Progress Notes (Signed)
Patient has complaint of aching pain in abdomen, 7/10. Abdomen is distended with hypoactive bowel sounds. LBM 12/14/22. Denies nausea. No episodes of vomiting. Mom at bedside. Attending hospitalist asked to come bedside. New order for abdominal X-ray.
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
## 2022-12-15 NOTE — Progress Notes (Signed)
Pt transported off the unit to dialysis in stable condition. Report given to dialysis nurse.

## 2022-12-15 NOTE — Progress Notes (Signed)
Received patient in bed to unit.  Alert and oriented.  Informed consent signed and in chart.   Treatment initiated: 2159 Treatment completed: 0015  Patient tolerated well.  Transported back to the room  Alert, without acute distress.  Hand-off given to patient's nurse.   Access used: dialysis cath Access issues: none  Total UF removed: 1L Medication(s) given: none Post HD VS: see table below Post HD weight: 157.7 kg   12/15/22 0012  Vitals  BP (!) 186/103  MAP (mmHg) 125  BP Location Right Arm  BP Method Automatic  Patient Position (if appropriate) Lying  Pulse Rate 86  Pulse Rate Source Monitor  ECG Heart Rate 85  Resp 14  Oxygen Therapy  SpO2 98 %  O2 Device Room Air  Patient Activity (if Appropriate) In bed  Pulse Oximetry Type Continuous  During Treatment Monitoring  Intra-Hemodialysis Comments Tolerated well  Post Treatment  Dialyzer Clearance Lightly streaked  Duration of HD Treatment -hour(s) 2 hour(s)  Hemodialysis Intake (mL) 0 mL  Liters Processed 24.1  Fluid Removed (mL) 1000 mL  Tolerated HD Treatment Yes  Post-Hemodialysis Comments goal met      [facility]

## 2022-12-15 NOTE — Progress Notes (Signed)
Patient has continued to refuse PO meds and dietary supplements at this time. Attending hospitalist and consulting nephrologist notified. IV Keppra given for seizure management. Oral BP meds will be held as part of pre-HD protocol. Sodium bicarbonate tablets were discontinued. Patient is in bed resting with eyes closed. Call bell within reach. Will continue to monitor. Mom at bedside.
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
## 2022-12-15 NOTE — Progress Notes (Signed)
Patient was given evening dose of Coreg and Bidil per approval from consulting nephrologist due to elevated BP. Verbal order for PM nurse to give night dose of Bidil as scheduled. Patient also consumed evening dose of Keppra as ordered.

## 2022-12-15 NOTE — Progress Notes (Signed)
Patient is lying in bed on his stomach. Stomach discomfort continuing. Patient is audibly belching and has complaint of gas pains. Attending hospitalist notified. New order generated for anti-gas medication. Patient's BP remains elevated. Refused oral medication at this time. IV hypertensive medication given as ordered. Call bell within reach. Mom at bedside. Will continue to monitor.

## 2022-12-16 DIAGNOSIS — E8721 Acute metabolic acidosis: Secondary | ICD-10-CM | POA: Diagnosis not present

## 2022-12-16 DIAGNOSIS — R569 Unspecified convulsions: Secondary | ICD-10-CM | POA: Diagnosis not present

## 2022-12-16 DIAGNOSIS — M6282 Rhabdomyolysis: Secondary | ICD-10-CM | POA: Diagnosis not present

## 2022-12-16 DIAGNOSIS — N179 Acute kidney failure, unspecified: Secondary | ICD-10-CM | POA: Diagnosis not present

## 2022-12-16 LAB — CK: Total CK: 997 U/L — ABNORMAL HIGH (ref 49–397)

## 2022-12-16 LAB — GLOMERULAR BASEMENT MEMBRANE ANTIBODIES: GBM Ab: <0.2 units (ref 0.0–0.9)

## 2022-12-16 LAB — COMPREHENSIVE METABOLIC PANEL
ALT: 70 U/L — ABNORMAL HIGH (ref 0–44)
AST: 59 U/L — ABNORMAL HIGH (ref 15–41)
Albumin: 2.8 g/dL — ABNORMAL LOW (ref 3.5–5.0)
Alkaline Phosphatase: 44 U/L (ref 38–126)
Anion gap: 17 — ABNORMAL HIGH (ref 5–15)
BUN: 46 mg/dL — ABNORMAL HIGH (ref 6–20)
CO2: 21 mmol/L — ABNORMAL LOW (ref 22–32)
Calcium: 8 mg/dL — ABNORMAL LOW (ref 8.9–10.3)
Chloride: 91 mmol/L — ABNORMAL LOW (ref 98–111)
Creatinine, Ser: 11.19 mg/dL — ABNORMAL HIGH (ref 0.61–1.24)
GFR, Estimated: 6 mL/min — ABNORMAL LOW (ref >60)
Glucose, Bld: 77 mg/dL (ref 70–99)
Potassium: 3.8 mmol/L (ref 3.5–5.1)
Sodium: 129 mmol/L — ABNORMAL LOW (ref 135–145)
Total Bilirubin: 0.8 mg/dL (ref 0.3–1.2)
Total Protein: 5.9 g/dL — ABNORMAL LOW (ref 6.5–8.1)

## 2022-12-16 LAB — MAGNESIUM: Magnesium: 2 mg/dL (ref 1.7–2.4)

## 2022-12-16 LAB — HEPATITIS B SURFACE ANTIBODY, QUANTITATIVE: Hep B S AB Quant (Post): <3.1 m[IU]/mL — ABNORMAL LOW (ref >9.9)

## 2022-12-16 MED ORDER — HEPARIN SODIUM (PORCINE) 1000 UNIT/ML DIALYSIS
1000.0000 [IU] | INTRAMUSCULAR | Status: DC | PRN
  Administered 2022-12-16: 2800 [IU]
  Filled 2022-12-16: qty 1

## 2022-12-16 MED ORDER — ISOSORB DINITRATE-HYDRALAZINE 20-37.5 MG PO TABS
2.0000 | ORAL_TABLET | Freq: Three times a day (TID) | ORAL | Status: DC
  Administered 2022-12-16 – 2022-12-22 (×16): 2 via ORAL
  Filled 2022-12-16 (×16): qty 2

## 2022-12-16 MED ORDER — ANTICOAGULANT SODIUM CITRATE 4% (200MG/5ML) IV SOLN: 5.0000 mL | Status: DC | PRN

## 2022-12-16 MED ORDER — LEVETIRACETAM 100 MG/ML PO SOLN
500.0000 mg | Freq: Two times a day (BID) | ORAL | Status: DC
  Administered 2022-12-16: 500 mg via ORAL
  Filled 2022-12-16 (×2): qty 5

## 2022-12-16 MED ORDER — ALTEPLASE 2 MG IJ SOLR: 2.0000 mg | Freq: Once | INTRAMUSCULAR | Status: DC | PRN

## 2022-12-16 MED ORDER — PANTOPRAZOLE SODIUM 40 MG PO TBEC
40.0000 mg | DELAYED_RELEASE_TABLET | Freq: Two times a day (BID) | ORAL | Status: DC
  Administered 2022-12-16: 40 mg via ORAL
  Filled 2022-12-16 (×3): qty 1

## 2022-12-16 MED ORDER — CARVEDILOL 12.5 MG PO TABS
12.5000 mg | ORAL_TABLET | Freq: Two times a day (BID) | ORAL | Status: DC
  Administered 2022-12-16 – 2022-12-17 (×2): 12.5 mg via ORAL
  Filled 2022-12-16 (×4): qty 1
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
## 2022-12-16 NOTE — Plan of Care (Signed)
Overnight on-call neurologist note  I was paged by the floor RN stating patient wants his Keppra to be changed to another AED.Gary Watson  He has been getting Keppra IV and reports abdominal discomfort.  He has not been compliant with medications and presented with status epilepticus which has now resolved. At this time, I do not think that changing antiepileptics is going to be a good idea, especially given that he has problems with compliance and any medicine that requires monitoring of levels etc., would be more difficult to be compliant with than Keppra.   Abdominal discomfort with IV Keppra is not usually a known side effect so I doubt that his abdominal discomfort is attributable to Keppra. I would recommend that the primary hospitalist have this discussion with him in the morning-to continue with Keppra.   Should the medicine rounding team have any questions, kindly do not hesitate to reach out to the rounding neurology team.  Plan was discussed with the patient's RN over the phone.  -- Amie Portland, MD Neurologist [facility] Pager: [phone]

## 2022-12-16 NOTE — Procedures (Signed)
HD Note:  Some information was entered later than the data was gathered due to patient care needs. The stated time with the data is accurate.  Received patient in bed to unit.  Alert and oriented.  Informed consent signed and in chart.    Patient tolerated well. BP did not decrease with treatment.see flow sheet  Transported back to the room  Alert, without acute distress.  Hand-off given to patient's nurse.   Access used: Right chest tunneled HD catheter Access issues: None  Total UF removed: 2500 ml   [facility]

## 2022-12-16 NOTE — Progress Notes (Signed)
Patient off floor to HD

## 2022-12-16 NOTE — Progress Notes (Addendum)
[facility] Progress Note    Assessment/ Plan:   AKI -suspecting secondary to rhabdo, likely evolved to ATN? No improvement in renal function with fluids and failed lasix challenge. U/S neg for obstruction. -Cr continued to worsen therefore started on RRT 1/25 after RIJ temp HD catheter placement with IR on 1/25. IHD#3 today. Next treatment either on Mon or Tues depending on clinical scenario -Based on UA 1/25, he does have microscopic hematuria (clean catch), Lupus serologies & anti-GBM neg. ANCA pending. Given etiology of his rapidly deteriorating kidney function is unclear, recommended renal biopsy sometime next week with IR. May end up seeing acute tubular injury but will need to be certain. Will need to get BP better controlled before proceeding with biopsy -Lupus serologies negative. CK improving -Avoid nephrotoxic medications including NSAIDs and iodinated intravenous contrast exposure unless the latter is absolutely indicated.  Preferred narcotic agents for pain control are hydromorphone, fentanyl, and methadone. Morphine should not be used. Avoid Baclofen and avoid oral sodium phosphate and magnesium citrate based laxatives / bowel preps. Continue strict Input and Output monitoring. 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   AGMA -likely secondary to AKI. Managing with HD   Rhabdomyolysis -CK improving, etiology was unclear--possibly related to keppra? Off propofol since 1/20 and has been seizure free.  Would recommend switching keppra to an alternative med if CK uptrends, discussed with primary service - lupus serologies neg   Hypertension: -UF as tolerated   AHRF -now extubated. Off abx  Hyponatremia -secondary to AKI, will manage with HD. Na up to 129  Addendum: bidil inc'ed to 2 tabs tid, coreg inc'ed to 12.5mg   BID  Subjective:   Patient seen and examined on dialysis. Tolerating treatment. Had spoken to his mother in his room earlier this AM. He does report ongoing issues with abd pain especially when he takes Keppra. Neuro alerted overnight. Ufg 2.5L   Objective:   BP (!) 185/102   Pulse 94   Temp 98.9 F (37.2 C) (Oral)   Resp 18   Ht 6\' 5"  (1.956 m)   Wt (!) 157.4 kg   SpO2 94%   BMI 41.15 kg/m   Intake/Output Summary (Last 24 hours) at 12/16/2022 1030 Last data filed at 12/15/2022 1835 Gross per 24 hour  Intake --  Output 1500 ml  Net -1500 ml   Weight change: -0.3 kg  Physical Exam: Gen: NAD, laying flat in bed CVS: RRR Resp: CTA B/L Abd: soft, nt/nd Ext: no edema Neuro: awake, alert, blind Dialysis access: RIJ temp HD catheter  Imaging: DG Abd 1 View  Result Date: 12/15/2022 CLINICAL DATA:  Abdominal pain loss of appetite for several days EXAM: ABDOMEN - 1 VIEW COMPARISON:  12/08/2022 FINDINGS: The lung bases appear clear. Most of the bowel gas pattern is unremarkable, although there is a mildly dilated segment of small bowel in the left lower quadrant measuring up to 3.2 cm in diameter. No significant abnormal calcifications are identified. IMPRESSION: 1. Mildly dilated segment of small bowel in the left lower quadrant measuring up to 3.2 cm in diameter, nonspecific but localized ileus is a consideration. Electronically Signed   By: Van Clines M.D.   On: 12/15/2022 09:32   IR Fluoro Guide CV Line Right  Result Date: 12/15/2022 INDICATION: Acute renal insufficiency. Please perform image guided placement of temporary dialysis catheter for initiation  of hemodialysis. EXAM: NON-TUNNELED CENTRAL VENOUS HEMODIALYSIS CATHETER PLACEMENT WITH ULTRASOUND AND FLUOROSCOPIC GUIDANCE COMPARISON:  None Available. MEDICATIONS: None FLUOROSCOPY TIME:  1 minute, 6 seconds (72 mGy) COMPLICATIONS: None immediate. PROCEDURE: Informed written consent was obtained from the patient after a  discussion of the risks, benefits, and alternatives to treatment. Questions regarding the procedure were encouraged and answered. The right neck and chest were prepped with chlorhexidine in a sterile fashion, and a sterile drape was applied covering the operative field. Maximum barrier sterile technique with sterile gowns and gloves were used for the procedure. A timeout was performed prior to the initiation of the procedure. After the overlying soft tissues were anesthetized, a small venotomy incision was created and a micropuncture kit was utilized to access the internal jugular vein. Real-time ultrasound guidance was utilized for vascular access including the acquisition of a permanent ultrasound image documenting patency of the accessed vessel. The microwire was utilized to measure appropriate catheter length. A stiff glidewire was advanced to the level of the IVC. Under fluoroscopic guidance, the venotomy was serially dilated, ultimately allowing placement of a 20 cm temporary Mahurkar catheter with tip ultimately terminating within the superior aspect of the right atrium. Final catheter positioning was confirmed and documented with a spot radiographic image. The catheter aspirates and flushes normally. The catheter was flushed with appropriate volume heparin dwells. The catheter exit site was secured with a 0-Prolene retention suture. A dressing was placed. The patient tolerated the procedure well without immediate post procedural complication. IMPRESSION: Successful placement of a right internal jugular approach 20 cm temporary dialysis catheter with tip terminating with in the superior aspect of the right atrium. The catheter is ready for immediate use. PLAN: This catheter may be converted to a tunneled dialysis catheter at a later date as indicated. Electronically Signed   By: Sandi Mariscal M.D.   On: 12/15/2022 08:28   IR US Guide Vasc Access Right  Result Date: 12/15/2022 INDICATION: Acute renal  insufficiency. Please perform image guided placement of temporary dialysis catheter for initiation of hemodialysis. EXAM: NON-TUNNELED CENTRAL VENOUS HEMODIALYSIS CATHETER PLACEMENT WITH ULTRASOUND AND FLUOROSCOPIC GUIDANCE COMPARISON:  None Available. MEDICATIONS: None FLUOROSCOPY TIME:  1 minute, 6 seconds (72 mGy) COMPLICATIONS: None immediate. PROCEDURE: Informed written consent was obtained from the patient after a discussion of the risks, benefits, and alternatives to treatment. Questions regarding the procedure were encouraged and answered. The right neck and chest were prepped with chlorhexidine in a sterile fashion, and a sterile drape was applied covering the operative field. Maximum barrier sterile technique with sterile gowns and gloves were used for the procedure. A timeout was performed prior to the initiation of the procedure. After the overlying soft tissues were anesthetized, a small venotomy incision was created and a micropuncture kit was utilized to access the internal jugular vein. Real-time ultrasound guidance was utilized for vascular access including the acquisition of a permanent ultrasound image documenting patency of the accessed vessel. The microwire was utilized to measure appropriate catheter length. A stiff glidewire was advanced to the level of the IVC. Under fluoroscopic guidance, the venotomy was serially dilated, ultimately allowing placement of a 20 cm temporary Mahurkar catheter with tip ultimately terminating within the superior aspect of the right atrium. Final catheter positioning was confirmed and documented with a spot radiographic image. The catheter aspirates and flushes normally. The catheter was flushed with appropriate volume heparin dwells. The catheter exit site was secured with a 0-Prolene retention suture. A dressing was placed. The patient  tolerated the procedure well without immediate post procedural complication. IMPRESSION: Successful placement of a right  internal jugular approach 20 cm temporary dialysis catheter with tip terminating with in the superior aspect of the right atrium. The catheter is ready for immediate use. PLAN: This catheter may be converted to a tunneled dialysis catheter at a later date as indicated. Electronically Signed   By: Sandi Mariscal M.D.   On: 12/15/2022 08:28    Labs: BMET Recent Labs  Lab 12/10/22 0322 12/11/22 0146 12/12/22 0800 12/13/22 0655 12/14/22 0657 12/15/22 0746 12/16/22 0343  NA 137 138 133* 133* 129* 128* 129*  K 4.5 3.9 3.2* 3.3* 3.5 3.3* 3.8  CL 109 107 100 97* 91* 89* 91*  CO2 16* 15* 17* 20* 17* 20* 21*  GLUCOSE 115* 90 99 99 92 83 77  BUN 34* 46* 51* 57* 65* 58* 46*  CREATININE 5.41* 7.71* 10.76* 12.59* 13.81* 13.27* 11.19*  CALCIUM 8.3* 8.2* 8.0* 7.8* 7.7* 7.9* 8.0*  PHOS 5.7* 4.5 6.0*  --   --   --   --    CBC Recent Labs  Lab 12/11/22 0146 12/12/22 0800 12/13/22 0655 12/15/22 1602  WBC 13.6* 7.3 7.6 6.7  HGB 13.5 12.7* 12.9* 11.6*  HCT 37.6* 37.4* 35.9* 33.5*  MCV 87.0 86.8 83.7 85.2  PLT 139* 168 172 179    Medications:     (feeding supplement) PROSource Plus  30 mL Oral BID BM   amLODipine  10 mg Oral Daily   carvedilol  6.25 mg Oral BID WC   Chlorhexidine Gluconate Cloth  6 each Topical Q0600   feeding supplement  237 mL Oral BID BM   heparin  5,000 Units Subcutaneous Q8H   isosorbide-hydrALAZINE  1 tablet Oral TID   levETIRAcetam  500 mg Oral Q12H   multivitamin  1 tablet Oral QHS   senna-docusate  1 tablet Oral BID      Gean Quint, MD [facility] 12/16/2022, 10:30 AM

## 2022-12-17 ENCOUNTER — Inpatient Hospital Stay (HOSPITAL_COMMUNITY): Payer: Medicaid Other

## 2022-12-17 DIAGNOSIS — M6282 Rhabdomyolysis: Secondary | ICD-10-CM | POA: Diagnosis not present

## 2022-12-17 DIAGNOSIS — E8721 Acute metabolic acidosis: Secondary | ICD-10-CM | POA: Diagnosis not present

## 2022-12-17 DIAGNOSIS — N179 Acute kidney failure, unspecified: Secondary | ICD-10-CM | POA: Diagnosis not present

## 2022-12-17 DIAGNOSIS — R569 Unspecified convulsions: Secondary | ICD-10-CM | POA: Diagnosis not present

## 2022-12-17 LAB — COMPREHENSIVE METABOLIC PANEL
ALT: 108 U/L — ABNORMAL HIGH (ref 0–44)
AST: 69 U/L — ABNORMAL HIGH (ref 15–41)
Albumin: 2.7 g/dL — ABNORMAL LOW (ref 3.5–5.0)
Alkaline Phosphatase: 37 U/L — ABNORMAL LOW (ref 38–126)
Anion gap: 15 (ref 5–15)
BUN: 35 mg/dL — ABNORMAL HIGH (ref 6–20)
CO2: 21 mmol/L — ABNORMAL LOW (ref 22–32)
Calcium: 8.3 mg/dL — ABNORMAL LOW (ref 8.9–10.3)
Chloride: 91 mmol/L — ABNORMAL LOW (ref 98–111)
Creatinine, Ser: 9.1 mg/dL — ABNORMAL HIGH (ref 0.61–1.24)
GFR, Estimated: 7 mL/min — ABNORMAL LOW (ref >60)
Glucose, Bld: 90 mg/dL (ref 70–99)
Potassium: 3.7 mmol/L (ref 3.5–5.1)
Sodium: 127 mmol/L — ABNORMAL LOW (ref 135–145)
Total Bilirubin: 1 mg/dL (ref 0.3–1.2)
Total Protein: 6.1 g/dL — ABNORMAL LOW (ref 6.5–8.1)

## 2022-12-17 LAB — MAGNESIUM: Magnesium: 1.8 mg/dL (ref 1.7–2.4)

## 2022-12-17 LAB — CK: Total CK: 487 U/L — ABNORMAL HIGH (ref 49–397)

## 2022-12-17 MED ORDER — LEVETIRACETAM IN NACL 500 MG/100ML IV SOLN
500.0000 mg | Freq: Two times a day (BID) | INTRAVENOUS | Status: DC
  Administered 2022-12-17 – 2022-12-20 (×7): 500 mg via INTRAVENOUS
  Filled 2022-12-17 (×7): qty 100

## 2022-12-17 NOTE — Progress Notes (Signed)
[facility] Progress Note    Assessment/ Plan:   AKI -suspecting secondary to rhabdo, likely evolved to ATN? No improvement in renal function with fluids and failed lasix challenge. U/S neg for obstruction. -Cr continued to worsen therefore started on RRT 1/25 after RIJ temp HD catheter placement with IR on 1/25. IHD#3 1/28. Next treatment tentatively planned for Tues if still without renal recovery -Based on UA 1/25, he does have microscopic hematuria (clean catch), Lupus serologies & anti-GBM neg. ANCA pending. Given etiology of his rapidly deteriorating kidney function is unclear, recommended renal biopsy sometime this coming week with IR (they have been consulted). May end up seeing acute tubular injury but will need to be certain. Will need to get BP better controlled before proceeding with biopsy -given uncontrolled BP, will check a renal artery duplex -Avoid nephrotoxic medications including NSAIDs and iodinated intravenous contrast exposure unless the latter is absolutely indicated.  Preferred narcotic agents for pain control are hydromorphone, fentanyl, and methadone. Morphine should not be used. Avoid Baclofen and avoid oral sodium phosphate and magnesium citrate based laxatives / bowel preps. Continue strict Input and Output monitoring. 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   AGMA -likely secondary to AKI. Managing with HD   Rhabdomyolysis -CK improved, etiology was unclear--possibly related to keppra? Off propofol since 1/20 and has been seizure free. - lupus serologies neg   Hypertension: -UF as tolerated -can add clonidine if needed   AHRF -now extubated. Off abx  Hyponatremia -secondary to AKI, will manage with HD. Na up to 129  Elevated LFTs -per primary  Subjective:   Patient seen and examined bedside this  am. He reports that he did better with receiving IV Keppra as opposed to PO. He does report that he feels like he is urinating a little more.   Objective:   BP (!) 182/105 (BP Location: Left Arm)   Pulse 90   Temp 98.2 F (36.8 C)   Resp 18   Ht 6\' 5"  (1.956 m)   Wt (!) 156 kg   SpO2 94%   BMI 40.78 kg/m   Intake/Output Summary (Last 24 hours) at 12/17/2022 1009 Last data filed at 12/17/2022 0542 Gross per 24 hour  Intake 840 ml  Output 2500 ml  Net -1660 ml   Weight change: -6.1 kg  Physical Exam: Gen: NAD, laying flat in bed CVS: RRR Resp: CTA B/L Abd: soft, nt/nd Ext: no edema Neuro: awake, alert, blind Dialysis access: RIJ temp HD catheter  Imaging: No results found.  Labs: BMET Recent Labs  Lab 12/11/22 0146 12/12/22 0800 12/13/22 0655 12/14/22 0657 12/15/22 0746 12/16/22 0343 12/17/22 0634  NA 138 133* 133* 129* 128* 129* 127*  K 3.9 3.2* 3.3* 3.5 3.3* 3.8 3.7  CL 107 100 97* 91* 89* 91* 91*  CO2 15* 17* 20* 17* 20* 21* 21*  GLUCOSE 90 99 99 92 83 77 90  BUN 46* 51* 57* 65* 58* 46* 35*  CREATININE 7.71* 10.76* 12.59* 13.81* 13.27* 11.19* 9.10*  CALCIUM 8.2* 8.0* 7.8* 7.7* 7.9* 8.0* 8.3*  PHOS 4.5 6.0*  --   --   --   --   --    CBC Recent Labs  Lab 12/11/22 0146 12/12/22 0800 12/13/22 0655 12/15/22 1602  WBC 13.6* 7.3 7.6 6.7  HGB 13.5 12.7* 12.9* 11.6*  HCT 37.6* 37.4*  35.9* 33.5*  MCV 87.0 86.8 83.7 85.2  PLT 139* 168 172 179    Medications:     (feeding supplement) PROSource Plus  30 mL Oral BID BM   amLODipine  10 mg Oral Daily   carvedilol  12.5 mg Oral BID WC   Chlorhexidine Gluconate Cloth  6 each Topical Q0600   feeding supplement  237 mL Oral BID BM   heparin  5,000 Units Subcutaneous Q8H   isosorbide-hydrALAZINE  2 tablet Oral TID   multivitamin  1 tablet Oral QHS   pantoprazole  40 mg Oral BID   senna-docusate  1 tablet Oral BID      Gean Quint, MD [facility] 12/17/2022, 10:09 AM

## 2022-12-17 NOTE — Progress Notes (Signed)
Patient is off the unit now. Getting abdominal CT ordered done.

## 2022-12-17 NOTE — Progress Notes (Signed)
Rn informed by CNA of patient icreases blood pressure that are out of parameters and abdominal discomfort. RN at bedside with patient, pt complained of "head throbbing through temple" Rn informed pt that that could be due to increased BP. Rn notifed MD Kshitiz of symptoms and MD ordered the Prn labetalol to be given. RN Sharlett Iles informed pt that PRN blood pressure medicine was the treatment that MD ordered; pt refused medication. Stating that its the PO blood pressure medications causing these symptoms. RN attempted to educate pt and family at bedside about HTN and Headaches, pt and family need reinforcement in regards to teaching. MD aware of patient refusal. See orders for CT ABD w/ PO contrast for pt due intermittent abdominal pain.
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
## 2022-12-17 NOTE — Progress Notes (Signed)
TRH night cross cover note:   I was notified by RN that the patient is refusing the majority of his evening medications, including his evening antihypertensive medications.  He is, however, amenable to receiving prn IV antihypertensive medications, noting an existing order for as needed IV labetalol.   Update: I spoke with the patient and his wife, who was present at bedside, at which time the patient conveyed to me that the reason he is refusing some of his p.o. blood pressure medications is due to some abdominal cramping, nausea that he is experiencing when he takes these meds.   I explained to him and his wife the importance of improved antihypertensive control, particularly given the implications of uncontrolled blood pressure on his kidneys, noting his presenting acute renal failure.  I confirmed that he has an existing order for prn IV Zofran to help with some of the nausea that he has been experiencing shortly after taking his blood pressure medications.   As the patient currently feels that he is unable to tolerate his oral blood pressure medications this evening on the basis of the above, I have ordered a dose of IV hydralazine x 1 now and increased the dose of his prn IV labetalol from 5 to 10 mg q2 hours. I also conveyed that his day rounding hospitalist and nephrologist will continue to work with him to further optimize his antihypertensive regimen moving forward.    Babs Bertin, DO Hospitalist

## 2022-12-17 NOTE — Plan of Care (Signed)
  Problem: Safety: Goal: Violent Restraint(s) Outcome: Not Progressing   Problem: Safety: Goal: Non-violent Restraint(s) Outcome: Not Progressing   Problem: Education: Goal: Ability to describe self-care measures that may prevent or decrease complications (Diabetes Survival Skills Education) will improve Outcome: Not Progressing Goal: Individualized Educational Video(s) Outcome: Not Progressing   Problem: Coping: Goal: Ability to adjust to condition or change in health will improve Outcome: Not Progressing   Problem: Fluid Volume: Goal: Ability to maintain a balanced intake and output will improve Outcome: Not Progressing   Problem: Health Behavior/Discharge Planning: Goal: Ability to identify and utilize available resources and services will improve Outcome: Not Progressing Goal: Ability to manage health-related needs will improve Outcome: Not Progressing   Problem: Metabolic: Goal: Ability to maintain appropriate glucose levels will improve Outcome: Not Progressing   Problem: Nutritional: Goal: Maintenance of adequate nutrition will improve Outcome: Not Progressing Goal: Progress toward achieving an optimal weight will improve Outcome: Not Progressing   Problem: Skin Integrity: Goal: Risk for impaired skin integrity will decrease Outcome: Not Progressing   Problem: Tissue Perfusion: Goal: Adequacy of tissue perfusion will improve Outcome: Not Progressing   Problem: Education: Goal: Knowledge of General Education information will improve Description: Including pain rating scale, medication(s)/side effects and non-pharmacologic comfort measures Outcome: Not Progressing   Problem: Health Behavior/Discharge Planning: Goal: Ability to manage health-related needs will improve Outcome: Not Progressing   Problem: Clinical Measurements: Goal: Ability to maintain clinical measurements within normal limits will improve Outcome: Not Progressing Goal: Will remain free  from infection Outcome: Not Progressing Goal: Diagnostic test results will improve Outcome: Not Progressing Goal: Respiratory complications will improve Outcome: Not Progressing Goal: Cardiovascular complication will be avoided Outcome: Not Progressing   Problem: Activity: Goal: Risk for activity intolerance will decrease Outcome: Not Progressing   Problem: Nutrition: Goal: Adequate nutrition will be maintained Outcome: Not Progressing   Problem: Coping: Goal: Level of anxiety will decrease Outcome: Not Progressing   Problem: Elimination: Goal: Will not experience complications related to bowel motility Outcome: Not Progressing Goal: Will not experience complications related to urinary retention Outcome: Not Progressing   Problem: Pain Managment: Goal: General experience of comfort will improve Outcome: Not Progressing   Problem: Safety: Goal: Ability to remain free from injury will improve Outcome: Not Progressing   Problem: Skin Integrity: Goal: Risk for impaired skin integrity will decrease Outcome: Not Progressing   Problem: Education: Goal: Expressions of having a comfortable level of knowledge regarding the disease process will increase Outcome: Not Progressing   Problem: Coping: Goal: Ability to adjust to condition or change in health will improve Outcome: Not Progressing Goal: Ability to identify appropriate support needs will improve Outcome: Not Progressing   Problem: Health Behavior/Discharge Planning: Goal: Compliance with prescribed medication regimen will improve Outcome: Not Progressing   Problem: Medication: Goal: Risk for medication side effects will decrease Outcome: Not Progressing   Problem: Clinical Measurements: Goal: Complications related to the disease process, condition or treatment will be avoided or minimized Outcome: Not Progressing Goal: Diagnostic test results will improve Outcome: Not Progressing   Problem: Safety: Goal:  Verbalization of understanding the information provided will improve Outcome: Not Progressing   Problem: Self-Concept: Goal: Level of anxiety will decrease Outcome: Not Progressing Goal: Ability to verbalize feelings about condition will improve Outcome: Not Progressing

## 2022-12-17 NOTE — Progress Notes (Signed)
Per handoff report, this patient has been refusing to take the majority of his medications including his blood pressure medications. During shift change, his BP was measured again and it continues to be elevated. Patient was educated about the importance of taking all his prescribed medicines, specially the ones to control his high blood pressure. Despite the education received, patient continued to refuse the majority of his medicines, including BP meds. However, patient agreed to received his PRN IV BP medication. Dr. Velia Meyer was made aware of this situation. No further orders given. Will continue to monitor.

## 2022-12-17 NOTE — Plan of Care (Signed)
  Problem: Safety: Goal: Violent Restraint(s) Outcome: Not Progressing   Problem: Safety: Goal: Non-violent Restraint(s) Outcome: Not Progressing   Problem: Education: Goal: Ability to describe self-care measures that may prevent or decrease complications (Diabetes Survival Skills Education) will improve Outcome: Not Progressing Goal: Individualized Educational Video(s) Outcome: Not Progressing   Problem: Coping: Goal: Ability to adjust to condition or change in health will improve Outcome: Not Progressing   Problem: Fluid Volume: Goal: Ability to maintain a balanced intake and output will improve Outcome: Not Progressing   Problem: Health Behavior/Discharge Planning: Goal: Ability to identify and utilize available resources and services will improve Outcome: Not Progressing Goal: Ability to manage health-related needs will improve Outcome: Not Progressing   Problem: Metabolic: Goal: Ability to maintain appropriate glucose levels will improve Outcome: Not Progressing   Problem: Nutritional: Goal: Maintenance of adequate nutrition will improve Outcome: Not Progressing Goal: Progress toward achieving an optimal weight will improve Outcome: Not Progressing   Problem: Skin Integrity: Goal: Risk for impaired skin integrity will decrease Outcome: Not Progressing   Problem: Tissue Perfusion: Goal: Adequacy of tissue perfusion will improve Outcome: Not Progressing   Problem: Education: Goal: Knowledge of General Education information will improve Description: Including pain rating scale, medication(s)/side effects and non-pharmacologic comfort measures Outcome: Not Progressing   Problem: Health Behavior/Discharge Planning: Goal: Ability to manage health-related needs will improve Outcome: Not Progressing   Problem: Clinical Measurements: Goal: Ability to maintain clinical measurements within normal limits will improve Outcome: Not Progressing Goal: Will remain free  from infection Outcome: Not Progressing Goal: Diagnostic test results will improve Outcome: Not Progressing Goal: Respiratory complications will improve Outcome: Not Progressing Goal: Cardiovascular complication will be avoided Outcome: Not Progressing   Problem: Activity: Goal: Risk for activity intolerance will decrease Outcome: Not Progressing   Problem: Nutrition: Goal: Adequate nutrition will be maintained Outcome: Not Progressing   Problem: Coping: Goal: Level of anxiety will decrease Outcome: Not Progressing   Problem: Elimination: Goal: Will not experience complications related to bowel motility Outcome: Not Progressing Goal: Will not experience complications related to urinary retention Outcome: Not Progressing   Problem: Pain Managment: Goal: General experience of comfort will improve Outcome: Not Progressing   Problem: Safety: Goal: Ability to remain free from injury will improve Outcome: Not Progressing   Problem: Skin Integrity: Goal: Risk for impaired skin integrity will decrease Outcome: Not Progressing   Problem: Education: Goal: Expressions of having a comfortable level of knowledge regarding the disease process will increase Outcome: Not Progressing   Problem: Coping: Goal: Ability to adjust to condition or change in health will improve Outcome: Not Progressing Goal: Ability to identify appropriate support needs will improve Outcome: Not Progressing   Problem: Health Behavior/Discharge Planning: Goal: Compliance with prescribed medication regimen will improve Outcome: Not Progressing   Problem: Medication: Goal: Risk for medication side effects will decrease Outcome: Not Progressing   Problem: Clinical Measurements: Goal: Complications related to the disease process, condition or treatment will be avoided or minimized Outcome: Not Progressing Goal: Diagnostic test results will improve Outcome: Not Progressing   Problem: Safety: Goal:  Verbalization of understanding the information provided will improve Outcome: Not Progressing   Problem: Self-Concept: Goal: Level of anxiety will decrease Outcome: Not Progressing Goal: Ability to verbalize feelings about condition will improve Outcome: Not Progressing   

## 2022-12-17 NOTE — Progress Notes (Signed)
TRH night cross cover note:   I was notified by RN that the patient is refusing his evening dose of Keppra 500 mg p.o. twice daily on the basis that he is concerned that this oral medication is causing him to experience abdominal cramps.  However, he conveys that he is amenable to converting his oral Keppra to IV route of administration, requesting that the first dose of IV Keppra be started on the morning of 12/17/2022, as opposed to first dose now.  Consistent with this request, I subsequently discontinued existing order for oral Keppra and ordered initiation of Keppra 500 mg IV twice daily, first dose to occur at 8 AM on 12/17/2022.      Babs Bertin, DO Hospitalist

## 2022-12-18 ENCOUNTER — Inpatient Hospital Stay (HOSPITAL_COMMUNITY): Payer: Medicaid Other

## 2022-12-18 DIAGNOSIS — I160 Hypertensive urgency: Secondary | ICD-10-CM

## 2022-12-18 DIAGNOSIS — R569 Unspecified convulsions: Secondary | ICD-10-CM | POA: Diagnosis not present

## 2022-12-18 DIAGNOSIS — N179 Acute kidney failure, unspecified: Secondary | ICD-10-CM | POA: Diagnosis not present

## 2022-12-18 DIAGNOSIS — M6282 Rhabdomyolysis: Secondary | ICD-10-CM | POA: Diagnosis not present

## 2022-12-18 DIAGNOSIS — E8721 Acute metabolic acidosis: Secondary | ICD-10-CM | POA: Diagnosis not present

## 2022-12-18 LAB — ANCA PROFILE
Anti-MPO Antibodies: <0.2 units (ref 0.0–0.9)
Anti-PR3 Antibodies: <0.2 units (ref 0.0–0.9)
Atypical P-ANCA titer: <1:20 {titer}
C-ANCA: <1:20 {titer}
P-ANCA: <1:20 {titer}

## 2022-12-18 LAB — PROTIME-INR
INR: 1.1 (ref 0.8–1.2)
Prothrombin Time: 14.4 seconds (ref 11.4–15.2)

## 2022-12-18 LAB — CBC WITH DIFFERENTIAL/PLATELET
Abs Immature Granulocytes: 0.04 10*3/uL (ref 0.00–0.07)
Basophils Absolute: 0 10*3/uL (ref 0.0–0.1)
Basophils Relative: 0 %
Eosinophils Absolute: 0.1 10*3/uL (ref 0.0–0.5)
Eosinophils Relative: 2 %
HCT: 31.8 % — ABNORMAL LOW (ref 39.0–52.0)
Hemoglobin: 10.9 g/dL — ABNORMAL LOW (ref 13.0–17.0)
Immature Granulocytes: 1 %
Lymphocytes Relative: 22 %
Lymphs Abs: 1.7 10*3/uL (ref 0.7–4.0)
MCH: 29.7 pg (ref 26.0–34.0)
MCHC: 34.3 g/dL (ref 30.0–36.0)
MCV: 86.6 fL (ref 80.0–100.0)
Monocytes Absolute: 1 10*3/uL (ref 0.1–1.0)
Monocytes Relative: 13 %
Neutro Abs: 4.8 10*3/uL (ref 1.7–7.7)
Neutrophils Relative %: 62 %
Platelets: 190 10*3/uL (ref 150–400)
RBC: 3.67 MIL/uL — ABNORMAL LOW (ref 4.22–5.81)
RDW: 12.7 % (ref 11.5–15.5)
WBC: 7.7 10*3/uL (ref 4.0–10.5)
nRBC: 0 % (ref 0.0–0.2)

## 2022-12-18 LAB — COMPREHENSIVE METABOLIC PANEL
ALT: 86 U/L — ABNORMAL HIGH (ref 0–44)
AST: 44 U/L — ABNORMAL HIGH (ref 15–41)
Albumin: 2.8 g/dL — ABNORMAL LOW (ref 3.5–5.0)
Alkaline Phosphatase: 36 U/L — ABNORMAL LOW (ref 38–126)
Anion gap: 14 (ref 5–15)
BUN: 37 mg/dL — ABNORMAL HIGH (ref 6–20)
CO2: 23 mmol/L (ref 22–32)
Calcium: 8.6 mg/dL — ABNORMAL LOW (ref 8.9–10.3)
Chloride: 91 mmol/L — ABNORMAL LOW (ref 98–111)
Creatinine, Ser: 9.24 mg/dL — ABNORMAL HIGH (ref 0.61–1.24)
GFR, Estimated: 7 mL/min — ABNORMAL LOW (ref >60)
Glucose, Bld: 76 mg/dL (ref 70–99)
Potassium: 3.6 mmol/L (ref 3.5–5.1)
Sodium: 128 mmol/L — ABNORMAL LOW (ref 135–145)
Total Bilirubin: 0.8 mg/dL (ref 0.3–1.2)
Total Protein: 6 g/dL — ABNORMAL LOW (ref 6.5–8.1)

## 2022-12-18 LAB — MAGNESIUM: Magnesium: 1.7 mg/dL (ref 1.7–2.4)

## 2022-12-18 LAB — CK: Total CK: 273 U/L (ref 49–397)

## 2022-12-18 MED ORDER — PANTOPRAZOLE SODIUM 40 MG IV SOLR
40.0000 mg | Freq: Two times a day (BID) | INTRAVENOUS | Status: DC
  Filled 2022-12-18 (×3): qty 10

## 2022-12-18 MED ORDER — LABETALOL HCL 5 MG/ML IV SOLN
10.0000 mg | INTRAVENOUS | Status: DC | PRN
  Administered 2022-12-19 – 2022-12-21 (×4): 10 mg via INTRAVENOUS
  Filled 2022-12-18 (×4): qty 4

## 2022-12-18 MED ORDER — SUCRALFATE 1 GM/10ML PO SUSP
1.0000 g | Freq: Three times a day (TID) | ORAL | Status: DC
  Filled 2022-12-18 (×10): qty 10

## 2022-12-18 MED ORDER — HYDRALAZINE HCL 20 MG/ML IJ SOLN
10.0000 mg | INTRAMUSCULAR | Status: DC | PRN
  Administered 2022-12-21: 10 mg via INTRAVENOUS
  Filled 2022-12-18: qty 1

## 2022-12-18 MED ORDER — HYDRALAZINE HCL 20 MG/ML IJ SOLN
10.0000 mg | Freq: Once | INTRAMUSCULAR | Status: AC
  Administered 2022-12-18: 10 mg via INTRAVENOUS
  Filled 2022-12-18: qty 1

## 2022-12-18 NOTE — TOC Progression Note (Signed)
Transition of Care [facility]) - Progression Note    Patient Details  Name: Gary Watson MRN: [id number] Date of Birth: 1991-09-27  [facility] CM/SW Contact  Pollie Friar, RN Phone Number: 12/18/2022, 4:04 PM  Clinical Narrative:    Pt continues to require HD. TOC following. Discharge disposition remains home with mom. TOC following.   Expected Discharge Plan: Home/Self Care Barriers to Discharge: Continued Medical Work up  Expected Discharge Plan and Services       Living arrangements for the past 2 months: Single Family Home                                       Social Determinants of Health (SDOH) Interventions SDOH Screenings   Food Insecurity: No Food Insecurity (12/12/2022)  Housing: Low Risk  (12/12/2022)  Transportation Needs: No Transportation Needs (12/12/2022)  Utilities: Not At Risk (12/12/2022)  Tobacco Use: High Risk (12/15/2022)    Readmission Risk Interventions     No data to display
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
## 2022-12-18 NOTE — Progress Notes (Signed)
Pt refusing to wear telemetry monitor. MD notified. MD verbal order to discontinue telemetry d/t pt refusing to wear telemetry monitor. Bedside RN and CCMD CMT notified as well.

## 2022-12-18 NOTE — Progress Notes (Signed)
Patient continues to refuse to allow use of pigtail for infusions. Patient has been educated on purpose of pigtail. Continues to refuse use. PIV placed per patient request. Fran Lowes, RN VAST

## 2022-12-18 NOTE — Progress Notes (Signed)
[facility] Progress Note    Assessment/ Plan:   AKI -suspecting secondary to rhabdo, likely evolved to ATN? No improvement in renal function with fluids and failed lasix challenge. U/S neg for obstruction. -Cr continued to worsen therefore started on RRT 1/25 after RIJ temp HD catheter placement with IR on 1/25. IHD#3 1/27 (2.5L). Next treatment tentatively planned for Tues if still without renal recovery; pt states he's urinating much more but need strict I&O's.  -Based on UA 1/25, he does have microscopic hematuria (clean catch), Lupus serologies & anti-GBM neg. ANCA and GBM neg. Complements are normal.  Given etiology of his rapidly deteriorating kidney function is unclear, recommended renal biopsy sometime this coming week with IR (they have been consulted). Planning on dialysis in the AM but need labs 1st.  May end up seeing acute tubular injury but will need to be certain. Will need to get BP better controlled before proceeding with biopsy -given uncontrolled BP, renal artery duplex checked and neg. -Avoid nephrotoxic medications including NSAIDs and iodinated intravenous contrast exposure unless the latter is absolutely indicated.  Preferred narcotic agents for pain control are hydromorphone, fentanyl, and methadone. Morphine should not be used. Avoid Baclofen and avoid oral sodium phosphate and magnesium citrate based laxatives / bowel preps. Continue strict Input and Output monitoring. 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   AGMA -likely secondary to AKI. Managing with HD   Rhabdomyolysis -CK improved, etiology was unclear--possibly related to keppra? Off propofol since 1/20 and has been seizure free. - lupus serologies neg   Hypertension: -UF as tolerated -can add clonidine if needed   AHRF -now extubated. Off  abx  Hyponatremia -secondary to AKI, will manage with HD. Na up to 129  Elevated LFTs -per primary  Subjective:   Patient seen and examined bedside this am. He reports that he did better and feels that he's voiding a lot more.    Objective:   BP (!) 158/86 (BP Location: Right Arm)   Pulse 91   Temp 98.2 F (36.8 C) (Oral)   Resp 18   Ht 6\' 5"  (1.956 m)   Wt (!) 156 kg   SpO2 96%   BMI 40.78 kg/m   Intake/Output Summary (Last 24 hours) at 12/18/2022 1743 Last data filed at 12/18/2022 9518 Gross per 24 hour  Intake 340 ml  Output --  Net 340 ml   Weight change: 3.2 kg  Physical Exam: Gen: NAD, sitting on side of bed CVS: RRR Resp: CTA B/L Abd: soft, nt/nd Ext: no edema Neuro: awake, alert, blind Dialysis access: RIJ temp HD catheter  Imaging: VAS US RENAL ARTERY DUPLEX  Result Date: 12/18/2022 ABDOMINAL VISCERAL Patient Name:  Gary Watson  Date of Exam:   12/18/2022 Medical Rec #: [id number]       Accession #:    [id number] Date of Birth: [birth year]      Patient Gender: M Patient Age:   32 years Exam Location:  [facility] Procedure:      VAS US RENAL ARTERY DUPLEX Referring Phys: [id number] [facility] -------------------------------------------------------------------------------- Indications: Acute Kidney Injury, uncontrolled hypertension Other Factors: Seizures. Performing Technologist: Oda Cogan RDMS, RVT  Examination Guidelines: A complete evaluation includes B-mode imaging, spectral Doppler, color Doppler, and power Doppler as needed of all accessible portions of each vessel. Bilateral testing is considered an integral part  of a complete examination. Limited examinations for reoccurring indications may be performed as noted.  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   +------------------+--------+--------+-------+ Right Renal ArteryPSV cm/sEDV cm/sComment +------------------+--------+--------+-------+ Origin               93      26           +------------------+--------+--------+-------+ Proximal            115      27           +------------------+--------+--------+-------+ Mid                  73      22           +------------------+--------+--------+-------+ Distal               78      26           +------------------+--------+--------+-------+ +-----------------+--------+--------+-------+ Left Renal ArteryPSV cm/sEDV cm/sComment +-----------------+--------+--------+-------+ Origin              73      21           +-----------------+--------+--------+-------+ Proximal           103      25           +-----------------+--------+--------+-------+ Mid                 89      23           +-----------------+--------+--------+-------+ Distal              90      20           +-----------------+--------+--------+-------+ +------------+--------+--------+----+-----------+--------+--------+----+ Right KidneyPSV cm/sEDV cm/sRI  Left KidneyPSV cm/sEDV cm/sRI   +------------+--------+--------+----+-----------+--------+--------+----+ Upper Pole  32      14      0.54Upper Pole 38      15      0.60 +------------+--------+--------+----+-----------+--------+--------+----+ Mid         35      14      0.        37      11      0.70  +------------+--------+--------+----+-----------+--------+--------+----+ Lower Pole  40      12      0.69Lower Pole 32      13      0.60 +------------+--------+--------+----+-----------+--------+--------+----+ Hilar       46      16      0.65Hilar      40      14      0.64 +------------+--------+--------+----+-----------+--------+--------+----+ +------------------+-----+------------------+-----+ Right Kidney  Left Kidney             +------------------+-----+------------------+-----+ RAR                    RAR                     +------------------+-----+------------------+-----+ RAR (manual)      1.2  RAR (manual)      1.1   +------------------+-----+------------------+-----+ Cortex                 Cortex                  +------------------+-----+------------------+-----+ Cortex thickness       Corex thickness         +------------------+-----+------------------+-----+ Kidney length (cm)14.15Kidney length (cm)14.32 +------------------+-----+------------------+-----+  Summary: Renal:  Right: No evidence of right renal artery stenosis. Normal right        Resisitive Index. Normal size right kidney. Left:  No evidence of left renal artery stenosis. Normal left        Resistive Index.  *See table(s) above for measurements and observations.  Diagnosing physician: Monica Martinez MD  Electronically signed by Monica Martinez MD on 12/18/2022 at 11:25:35 AM.    Final    CT ABDOMEN PELVIS WO CONTRAST  Result Date: 12/17/2022 CLINICAL DATA:  Abdominal pain, renal insufficiency, scheduled for renal biopsy tomorrow EXAM: CT ABDOMEN AND PELVIS WITHOUT CONTRAST TECHNIQUE: Multidetector CT imaging of the abdomen and pelvis was performed following the standard protocol without IV contrast. RADIATION DOSE REDUCTION: This exam was performed according to the departmental dose-optimization program which includes automated exposure control, adjustment of the mA and/or kV  according to patient size and/or use of iterative reconstruction technique. COMPARISON:  Renal ultrasound dated 12/11/2022 FINDINGS: Lower chest: Mild scarring/atelectasis lung bases. Hepatobiliary: Unenhanced liver is unremarkable. Layering gallbladder sludge. No intrahepatic or extrahepatic duct dilatation. Pancreas: Within normal limits. Spleen: Within normal limits. Adrenals/Urinary Tract: Adrenal glands are within normal limits. Unenhanced kidneys are grossly unremarkable. No renal calculi or hydronephrosis. Bladder is underdistended. Stomach/Bowel: Stomach is within normal limits. No evidence of bowel obstruction. Normal appendix (series 3/image 68). No colonic wall thickening or inflammatory changes. Vascular/Lymphatic: No evidence of abdominal aortic aneurysm. Retroaortic left renal vein. No suspicious abdominopelvic lymphadenopathy. Reproductive: Prostate is unremarkable. Other: Trace presacral fluid. Tiny fat containing right inguinal hernia. Musculoskeletal: Mild degenerative changes at L5-S1. IMPRESSION: Negative CT abdomen/pelvis. Electronically Signed   By: Julian Hy M.D.   On: 12/17/2022 22:41    Labs: BMET Recent Labs  Lab 12/12/22 0800 12/13/22 0655 12/14/22 0657 12/15/22 0746 12/16/22 0343 12/17/22 0634 12/18/22 0740  NA 133* 133* 129* 128* 129* 127* 128*  K 3.2* 3.3* 3.5 3.3* 3.8 3.7 3.6  CL 100 97* 91* 89* 91* 91* 91*  CO2 17* 20* 17* 20* 21* 21* 23  GLUCOSE 99 99 92 83 77 90 76  BUN 51* 57* 65* 58* 46* 35* 37*  CREATININE 10.76* 12.59* 13.81* 13.27* 11.19* 9.10* 9.24*  CALCIUM 8.0* 7.8* 7.7* 7.9* 8.0* 8.3* 8.6*  PHOS 6.0*  --   --   --   --   --   --    CBC Recent Labs  Lab 12/12/22 0800 12/13/22 0655 12/15/22 1602 12/18/22 0740  WBC 7.3 7.6 6.7 7.7  NEUTROABS  --   --   --  4.8  HGB 12.7* 12.9* 11.6* 10.9*  HCT 37.4* 35.9* 33.5* 31.8*  MCV 86.8 83.7 85.2 86.6  PLT  168 172 179 190    Medications:     (feeding supplement) PROSource Plus  30 mL Oral  BID BM   amLODipine  10 mg Oral Daily   carvedilol  12.5 mg Oral BID WC   Chlorhexidine Gluconate Cloth  6 each Topical Q0600   feeding supplement  237 mL Oral BID BM   heparin  5,000 Units Subcutaneous Q8H   isosorbide-hydrALAZINE  2 tablet Oral TID   multivitamin  1 tablet Oral QHS   pantoprazole (PROTONIX) IV  40 mg Intravenous Q12H   senna-docusate  1 tablet Oral BID   sucralfate  1 g Oral TID WC & HS      Otelia Santee, MD [facility] 12/18/2022, 5:43 PM

## 2022-12-19 DIAGNOSIS — M6282 Rhabdomyolysis: Secondary | ICD-10-CM | POA: Diagnosis not present

## 2022-12-19 DIAGNOSIS — R569 Unspecified convulsions: Secondary | ICD-10-CM | POA: Diagnosis not present

## 2022-12-19 DIAGNOSIS — E8721 Acute metabolic acidosis: Secondary | ICD-10-CM | POA: Diagnosis not present

## 2022-12-19 DIAGNOSIS — N179 Acute kidney failure, unspecified: Secondary | ICD-10-CM | POA: Diagnosis not present

## 2022-12-19 LAB — CBC WITH DIFFERENTIAL/PLATELET
Abs Immature Granulocytes: 0.04 10*3/uL (ref 0.00–0.07)
Basophils Absolute: 0 10*3/uL (ref 0.0–0.1)
Basophils Relative: 0 %
Eosinophils Absolute: 0.1 10*3/uL (ref 0.0–0.5)
Eosinophils Relative: 2 %
HCT: 31.7 % — ABNORMAL LOW (ref 39.0–52.0)
Hemoglobin: 11.3 g/dL — ABNORMAL LOW (ref 13.0–17.0)
Immature Granulocytes: 1 %
Lymphocytes Relative: 20 %
Lymphs Abs: 1.5 10*3/uL (ref 0.7–4.0)
MCH: 30.6 pg (ref 26.0–34.0)
MCHC: 35.6 g/dL (ref 30.0–36.0)
MCV: 85.9 fL (ref 80.0–100.0)
Monocytes Absolute: 0.9 10*3/uL (ref 0.1–1.0)
Monocytes Relative: 11 %
Neutro Abs: 5.3 10*3/uL (ref 1.7–7.7)
Neutrophils Relative %: 66 %
Platelets: 222 10*3/uL (ref 150–400)
RBC: 3.69 MIL/uL — ABNORMAL LOW (ref 4.22–5.81)
RDW: 12.6 % (ref 11.5–15.5)
WBC: 7.9 10*3/uL (ref 4.0–10.5)
nRBC: 0 % (ref 0.0–0.2)

## 2022-12-19 LAB — COMPREHENSIVE METABOLIC PANEL WITH GFR
ALT: 75 U/L — ABNORMAL HIGH (ref 0–44)
AST: 36 U/L (ref 15–41)
Albumin: 2.9 g/dL — ABNORMAL LOW (ref 3.5–5.0)
Alkaline Phosphatase: 41 U/L (ref 38–126)
Anion gap: 13 (ref 5–15)
BUN: 42 mg/dL — ABNORMAL HIGH (ref 6–20)
CO2: 24 mmol/L (ref 22–32)
Calcium: 8.8 mg/dL — ABNORMAL LOW (ref 8.9–10.3)
Chloride: 93 mmol/L — ABNORMAL LOW (ref 98–111)
Creatinine, Ser: 8.26 mg/dL — ABNORMAL HIGH (ref 0.61–1.24)
GFR, Estimated: 8 mL/min — ABNORMAL LOW
Glucose, Bld: 94 mg/dL (ref 70–99)
Potassium: 3.9 mmol/L (ref 3.5–5.1)
Sodium: 130 mmol/L — ABNORMAL LOW (ref 135–145)
Total Bilirubin: 0.8 mg/dL (ref 0.3–1.2)
Total Protein: 6.3 g/dL — ABNORMAL LOW (ref 6.5–8.1)

## 2022-12-19 LAB — CK: Total CK: 181 U/L (ref 49–397)

## 2022-12-19 LAB — MISC LABCORP TEST (SEND OUT): Labcorp test code: 700902

## 2022-12-19 LAB — MAGNESIUM: Magnesium: 1.7 mg/dL (ref 1.7–2.4)

## 2022-12-19 NOTE — Progress Notes (Signed)
[facility] Progress Note    Assessment/ Plan:   AKI -suspecting secondary to rhabdo, likely evolved to ATN? No improvement in renal function with fluids and failed lasix challenge. U/S neg for obstruction. -Cr continued to worsen therefore started on RRT 1/25 after RIJ temp HD catheter placement with IR on 1/25. IHD#3 1/27 (2.5L). Next treatment tentatively planned for Tues if still without renal recovery; pt states he's urinating much more but need strict I&O's. Refused HD this AM which is reasonable and will have lab draw the [facility] before HD 2nd shift today.   -Based on UA 1/25, he does have microscopic hematuria (clean catch), Lupus serologies & anti-GBM neg. ANCA and GBM neg. Complements are normal.  Given etiology of his rapidly deteriorating kidney function is unclear  initially recommended renal biopsy sometime this coming week with IR (they have been consulted); pt refusing BP meds and can't do the biopsy without better BP control. Initial urinalysis was normal and only later had sediment. If renal function starts to improve would like to avoid a biopsy at that time; pt really doesn't want the biopsy and has been refusing bp meds placing him at high risk for a bleed.   May end up seeing acute tubular injury but will need to be certain. Will need to get BP better controlled before proceeding with biopsy -given uncontrolled BP, renal artery duplex checked and neg. -Avoid nephrotoxic medications including NSAIDs and iodinated intravenous contrast exposure unless the latter is absolutely indicated.  Preferred narcotic agents for pain control are hydromorphone, fentanyl, and methadone. Morphine should not be used. Avoid Baclofen and avoid oral sodium phosphate and magnesium citrate based laxatives / bowel preps. Continue strict Input and Output monitoring. 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   AGMA -likely secondary to AKI. Managing with HD   Rhabdomyolysis -CK improved, etiology was unclear--possibly related to keppra? Off propofol since 1/20 and has been seizure free. - lupus serologies neg   Hypertension: -UF as tolerated -can add clonidine if needed   AHRF -now extubated. Off abx  Hyponatremia -secondary to AKI, will manage with HD. Na up to 129  Elevated LFTs -per primary  Subjective:   Patient seen and examined bedside this am. He reports that he did better and feels that he's voiding a lot more.    Objective:   BP (!) 146/72 (BP Location: Right Arm)   Pulse (!) 110   Temp 98.4 F (36.9 C) (Oral)   Resp 18   Ht 6\' 5"  (1.956 m)   Wt (!) 156 kg   SpO2 99%   BMI 40.78 kg/m   Intake/Output Summary (Last 24 hours) at 12/19/2022 0735 Last data filed at 12/19/2022 12/21/2022 Gross per 24 hour  Intake 720 ml  Output 100 ml  Net 620 ml   Weight change:   Physical Exam: Gen: NAD, sitting on side of bed CVS: RRR Resp: CTA B/L Abd: soft, nt/nd Ext: no edema Neuro: awake, alert, blind Dialysis access: RIJ temp HD catheter  Imaging: [id number] RENAL ARTERY DUPLEX  Result Date: 12/18/2022 ABDOMINAL VISCERAL Patient Name:  Gary Watson  Date of Exam:   12/18/2022 Medical Rec #: [id number]       Accession #:    [id number] Date of Birth: 07-23-1991      Patient Gender: M Patient Age:   32  years Exam Location:  [facility] Procedure:      VAS US RENAL ARTERY DUPLEX Referring Phys: [id number] [facility] -------------------------------------------------------------------------------- Indications: Acute Kidney Injury, uncontrolled hypertension Other Factors: Seizures. Performing Technologist: Oda Cogan RDMS, RVT  Examination Guidelines: A complete evaluation includes B-mode imaging, spectral Doppler, color Doppler, and power Doppler as needed of all accessible portions of each vessel. Bilateral testing is  considered an integral part of a complete examination. Limited examinations for reoccurring indications may be performed as noted.  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   +------------------+--------+--------+-------+ Right Renal ArteryPSV cm/sEDV cm/sComment +------------------+--------+--------+-------+ Origin               93      26           +------------------+--------+--------+-------+ Proximal            115      27           +------------------+--------+--------+-------+ Mid                  73      22           +------------------+--------+--------+-------+ Distal               78      26           +------------------+--------+--------+-------+ +-----------------+--------+--------+-------+ Left Renal ArteryPSV cm/sEDV cm/sComment +-----------------+--------+--------+-------+ Origin              73      21           +-----------------+--------+--------+-------+ Proximal           103      25           +-----------------+--------+--------+-------+ Mid                 89      23           +-----------------+--------+--------+-------+ Distal              90      20           +-----------------+--------+--------+-------+ +------------+--------+--------+----+-----------+--------+--------+----+ Right KidneyPSV cm/sEDV cm/sRI  Left KidneyPSV cm/sEDV  cm/sRI   +------------+--------+--------+----+-----------+--------+--------+----+ Upper Pole  32      14      0.54Upper Pole 38      15      0.60 +------------+--------+--------+----+-----------+--------+--------+----+ Mid         35      14      0.59Mid        37      11      0.70 +------------+--------+--------+----+-----------+--------+--------+----+ Lower Pole  40  12      0.69Lower Pole 32      13      0.60 +------------+--------+--------+----+-----------+--------+--------+----+ Hilar       46      16      0.65Hilar      40      14      0.64 +------------+--------+--------+----+-----------+--------+--------+----+ +------------------+-----+------------------+-----+ Right Kidney           Left Kidney             +------------------+-----+------------------+-----+ RAR                    RAR                     +------------------+-----+------------------+-----+ RAR (manual)      1.2  RAR (manual)      1.1   +------------------+-----+------------------+-----+ Cortex                 Cortex                  +------------------+-----+------------------+-----+ Cortex thickness       Corex thickness         +------------------+-----+------------------+-----+ Kidney length (cm)14.15Kidney length (cm)14.32 +------------------+-----+------------------+-----+  Summary: Renal:  Right: No evidence of right renal artery stenosis. Normal right        Resisitive Index. Normal size right kidney. Left:  No evidence of left renal artery stenosis. Normal left        Resistive Index.  *See table(s) above for measurements and observations.  Diagnosing physician: Monica Martinez MD  Electronically signed by Monica Martinez MD on 12/18/2022 at 11:25:35 AM.    Final    CT ABDOMEN PELVIS WO CONTRAST  Result Date: 12/17/2022 CLINICAL DATA:  Abdominal pain, renal insufficiency, scheduled for renal biopsy tomorrow EXAM: CT ABDOMEN AND PELVIS WITHOUT CONTRAST  TECHNIQUE: Multidetector CT imaging of the abdomen and pelvis was performed following the standard protocol without IV contrast. RADIATION DOSE REDUCTION: This exam was performed according to the departmental dose-optimization program which includes automated exposure control, adjustment of the mA and/or kV according to patient size and/or use of iterative reconstruction technique. COMPARISON:  Renal ultrasound dated 12/11/2022 FINDINGS: Lower chest: Mild scarring/atelectasis lung bases. Hepatobiliary: Unenhanced liver is unremarkable. Layering gallbladder sludge. No intrahepatic or extrahepatic duct dilatation. Pancreas: Within normal limits. Spleen: Within normal limits. Adrenals/Urinary Tract: Adrenal glands are within normal limits. Unenhanced kidneys are grossly unremarkable. No renal calculi or hydronephrosis. Bladder is underdistended. Stomach/Bowel: Stomach is within normal limits. No evidence of bowel obstruction. Normal appendix (series 3/image 68). No colonic wall thickening or inflammatory changes. Vascular/Lymphatic: No evidence of abdominal aortic aneurysm. Retroaortic left renal vein. No suspicious abdominopelvic lymphadenopathy. Reproductive: Prostate is unremarkable. Other: Trace presacral fluid. Tiny fat containing right inguinal hernia. Musculoskeletal: Mild degenerative changes at L5-S1. IMPRESSION: Negative CT abdomen/pelvis. Electronically Signed   By: Julian Hy M.D.   On: 12/17/2022 22:41    Labs: BMET Recent Labs  Lab 12/12/22 0800 12/13/22 0655 12/14/22 0657 12/15/22 0746 12/16/22 0343 12/17/22 0634 12/18/22 0740  NA 133* 133* 129* 128* 129* 127* 128*  K 3.2* 3.3* 3.5 3.3* 3.8 3.7 3.6  CL 100 97* 91* 89* 91* 91* 91*  CO2 17* 20* 17* 20* 21* 21* 23  GLUCOSE 99 99 92 83 77 90 76  BUN 51* 57* 65* 58* 46* 35* 37*  CREATININE 10.76* 12.59* 13.81* 13.27* 11.19* 9.10* 9.24*  CALCIUM 8.0* 7.8* 7.7* 7.9* 8.0* 8.3* 8.6*  PHOS 6.0*  --   --   --   --   --   --     CBC Recent Labs  Lab 12/12/22 0800 12/13/22 0655 12/15/22 1602 12/18/22 0740  WBC 7.3 7.6 6.7 7.7  NEUTROABS  --   --   --  4.8  HGB 12.7* 12.9* 11.6* 10.9*  HCT 37.4* 35.9* 33.5* 31.8*  MCV 86.8 83.7 85.2 86.6  PLT 168 172 179 190    Medications:     (feeding supplement) PROSource Plus  30 mL Oral BID BM   amLODipine  10 mg Oral Daily   carvedilol  12.5 mg Oral BID WC   Chlorhexidine Gluconate Cloth  6 each Topical Q0600   feeding supplement  237 mL Oral BID BM   heparin  5,000 Units Subcutaneous Q8H   isosorbide-hydrALAZINE  2 tablet Oral TID   multivitamin  1 tablet Oral QHS   pantoprazole (PROTONIX) IV  40 mg Intravenous Q12H   senna-docusate  1 tablet Oral BID   sucralfate  1 g Oral TID WC & HS      Otelia Santee, MD [facility] 12/19/2022, 7:35 AM

## 2022-12-19 NOTE — Progress Notes (Signed)
Spoke to [employer] that pt is refusing dialysis this am. Asked her to speak with pt to educate him that  in hospital there aren't specific times he can come to HD we are ready for him 1st shift this am.

## 2022-12-19 NOTE — Progress Notes (Signed)
   IR aware of Renal bx requested per Nephrology BP still 683 systolic today Will need under 150-160 consistently to safely move ahead.  Pt is refusing BP meds per Neph note  We will keep eye on chart to determine timing Maybe end of week----maybe next week?
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
## 2022-12-19 NOTE — Progress Notes (Signed)
[facility] night cross cover note:   I was notified by RN that IV team was able to reestablish a peripheral IV after patient was refusing to receive Keppra administered through his PICC line.  Patient now amenable to receive Keppra through the newly established peripheral IV.     Babs Bertin, DO Hospitalist

## 2022-12-20 DIAGNOSIS — E8721 Acute metabolic acidosis: Secondary | ICD-10-CM | POA: Diagnosis not present

## 2022-12-20 DIAGNOSIS — N179 Acute kidney failure, unspecified: Secondary | ICD-10-CM | POA: Diagnosis not present

## 2022-12-20 DIAGNOSIS — R569 Unspecified convulsions: Secondary | ICD-10-CM | POA: Diagnosis not present

## 2022-12-20 DIAGNOSIS — M6282 Rhabdomyolysis: Secondary | ICD-10-CM | POA: Diagnosis not present

## 2022-12-20 LAB — COMPREHENSIVE METABOLIC PANEL
ALT: 63 U/L — ABNORMAL HIGH (ref 0–44)
AST: 30 U/L (ref 15–41)
Albumin: 2.9 g/dL — ABNORMAL LOW (ref 3.5–5.0)
Alkaline Phosphatase: 34 U/L — ABNORMAL LOW (ref 38–126)
Anion gap: 13 (ref 5–15)
BUN: 36 mg/dL — ABNORMAL HIGH (ref 6–20)
CO2: 23 mmol/L (ref 22–32)
Calcium: 8.6 mg/dL — ABNORMAL LOW (ref 8.9–10.3)
Chloride: 96 mmol/L — ABNORMAL LOW (ref 98–111)
Creatinine, Ser: 6.66 mg/dL — ABNORMAL HIGH (ref 0.61–1.24)
GFR, Estimated: 11 mL/min — ABNORMAL LOW (ref >60)
Glucose, Bld: 93 mg/dL (ref 70–99)
Potassium: 4 mmol/L (ref 3.5–5.1)
Sodium: 132 mmol/L — ABNORMAL LOW (ref 135–145)
Total Bilirubin: 0.8 mg/dL (ref 0.3–1.2)
Total Protein: 6 g/dL — ABNORMAL LOW (ref 6.5–8.1)

## 2022-12-20 LAB — MAGNESIUM: Magnesium: 1.7 mg/dL (ref 1.7–2.4)

## 2022-12-20 MED ORDER — LEVETIRACETAM 100 MG/ML PO SOLN
500.0000 mg | Freq: Two times a day (BID) | ORAL | Status: DC
  Administered 2022-12-20 – 2022-12-22 (×4): 500 mg via ORAL
  Filled 2022-12-20 (×4): qty 5

## 2022-12-20 MED ORDER — STERILE WATER FOR INJECTION IJ SOLN
INTRAMUSCULAR | Status: AC
  Filled 2022-12-20: qty 10
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
## 2022-12-20 NOTE — Progress Notes (Signed)
[facility] Progress Note    Assessment/ Plan:   AKI -suspecting secondary to rhabdo, likely evolved to ATN? No improvement in renal function with fluids and failed lasix challenge. U/S neg for obstruction. -Cr continued to worsen therefore started on RRT 1/25 after RIJ temp HD catheter placement with IR on 1/25. IHD#3 1/27 (2.5L). Next treatment tentatively planned for Tues if still without renal recovery; pt states he's urinating much more but need strict I&O's. Refused HD this AM which is reasonable and will have lab draw the [facility] before HD 2nd shift today.   -Based on UA 1/25, he does have microscopic hematuria (clean catch) but initial u/a was normal;  Lupus serologies & anti-GBM neg. ANCA and GBM neg. Complements are normal.  Given etiology of his rapidly deteriorating kidney function is unclear  initially recommended renal biopsy but with the pt refusing BP meds + improving renal function + initial normal urinalysis will hold off on renal biopsy.  Renal function improving and I was comfortable removing the RIJ temp cath today. I held pressure for 5 min with hemostasis obtained and then placed a pressure bandage.  He will need f/u w/ [facility] in 2 weeks with [facility] and repeat U/A; contacted office and they will call the pt. Signing off at this time; please reconsult as needed.  LIkely acute tubular injury.  -given uncontrolled BP, renal artery duplex checked and neg. -Avoid nephrotoxic medications including NSAIDs and iodinated intravenous contrast exposure unless the latter is absolutely indicated.  Preferred narcotic agents for pain control are hydromorphone, fentanyl, and methadone. Morphine should not be used. Avoid Baclofen and avoid oral sodium phosphate and magnesium citrate based laxatives / bowel preps. Continue strict Input and Output monitoring. 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   AGMA -likely secondary to AKI. Managing with HD   Rhabdomyolysis -CK improved, etiology was unclear--possibly related to keppra? Off propofol since 1/20 and has been seizure free. - lupus serologies neg   Hypertension: -UF as tolerated -can add clonidine if needed   AHRF -now extubated. Off abx  Hyponatremia -secondary to AKI, will manage with HD. Na up to 129  Elevated LFTs -per primary  Subjective:   Patient seen and examined bedside this am. He reports that he did better and feels that he's voiding a lot more.    Objective:   BP (!) 160/70 (BP Location: Left Wrist)   Pulse 94   Temp 98.3 F (36.8 C) (Oral)   Resp 16   Ht 6\' 5"  (1.956 m)   Wt (!) 151.5 kg   SpO2 99%   BMI 39.61 kg/m   Intake/Output Summary (Last 24 hours) at 12/20/2022 1347 Last data filed at 12/20/2022 0600 Gross per 24 hour  Intake 480 ml  Output --  Net 480 ml   Weight change:   Physical Exam: Gen: NAD, sitting on side of bed CVS: RRR Resp: CTA B/L Abd: soft, nt/nd Ext: no edema Neuro: awake, alert, blind Dialysis access: RIJ temp HD catheter  Imaging: No results found.  Labs: BMET Recent Labs  Lab 12/14/22 [phone] 12/15/22 0746 12/16/22 0343 12/17/22 0634 12/18/22 0740 12/19/22 0801 12/20/22 0628  NA 129* 128* 129* 127* 128* 130* 132*  K 3.5 3.3* 3.8 3.7 3.6 3.9 4.0  CL 91* 89* 91* 91* 91* 93* 96*  CO2 17* 20* 21* 21* 23  24 23  GLUCOSE 92 83 77 90 76 94 93  BUN 65* 58* 46* 35* 37* 42* 36*  CREATININE 13.81* 13.27* 11.19* 9.10* 9.24* 8.26* 6.66*  CALCIUM 7.7* 7.9* 8.0* 8.3* 8.6* 8.8* 8.6*   CBC Recent Labs  Lab 12/15/22 1602 12/18/22 0740 12/19/22 0801  WBC 6.7 7.7 7.9  NEUTROABS  --  4.8 5.3  HGB 11.6* 10.9* 11.3*  HCT 33.5* 31.8* 31.7*  MCV 85.2 86.6 85.9  PLT 179 190 222    Medications:     (feeding supplement) PROSource Plus  30 mL Oral BID BM   amLODipine  10 mg Oral Daily   carvedilol  12.5  mg Oral BID WC   Chlorhexidine Gluconate Cloth  6 each Topical Q0600   feeding supplement  237 mL Oral BID BM   heparin  5,000 Units Subcutaneous Q8H   isosorbide-hydrALAZINE  2 tablet Oral TID   multivitamin  1 tablet Oral QHS   sterile water (preservative free)          Otelia Santee, MD [facility] 12/20/2022, 1:47 PM

## 2022-12-20 NOTE — Plan of Care (Signed)
  Problem: Education: Goal: Ability to describe self-care measures that may prevent or decrease complications (Diabetes Survival Skills Education) will improve Outcome: Progressing   Problem: Education: Goal: Individualized Educational Video(s) Outcome: Progressing   Problem: Health Behavior/Discharge Planning: Goal: Ability to identify and utilize available resources and services will improve Outcome: Progressing   Problem: Metabolic: Goal: Ability to maintain appropriate glucose levels will improve Outcome: Progressing

## 2022-12-20 NOTE — Final Progress Note (Signed)
Pt continue refusing HTN medication I.e amlodipine and coreg. He is very adamant to only take Bidil with oxycodone as he believes that other medication is causing his abdominal discomfort. He also been refusing protonix and sucrafate. Despite education of the scheduled medication, patient does not want to take other medication for elevated BP. Notifed MD for further education.

## 2022-12-21 DIAGNOSIS — R569 Unspecified convulsions: Secondary | ICD-10-CM | POA: Diagnosis not present

## 2022-12-21 LAB — BASIC METABOLIC PANEL
Anion gap: 14 (ref 5–15)
BUN: 33 mg/dL — ABNORMAL HIGH (ref 6–20)
CO2: 26 mmol/L (ref 22–32)
Calcium: 9.2 mg/dL (ref 8.9–10.3)
Chloride: 97 mmol/L — ABNORMAL LOW (ref 98–111)
Creatinine, Ser: 5.07 mg/dL — ABNORMAL HIGH (ref 0.61–1.24)
GFR, Estimated: 15 mL/min — ABNORMAL LOW (ref >60)
Glucose, Bld: 96 mg/dL (ref 70–99)
Potassium: 4.4 mmol/L (ref 3.5–5.1)
Sodium: 137 mmol/L (ref 135–145)

## 2022-12-21 LAB — MAGNESIUM: Magnesium: 1.7 mg/dL (ref 1.7–2.4)

## 2022-12-21 MED ORDER — CARVEDILOL 6.25 MG PO TABS
6.2500 mg | ORAL_TABLET | Freq: Two times a day (BID) | ORAL | Status: DC
  Administered 2022-12-21 – 2022-12-22 (×2): 6.25 mg via ORAL
  Filled 2022-12-21 (×2): qty 1

## 2022-12-21 MED ORDER — HYDRALAZINE HCL 25 MG PO TABS
25.0000 mg | ORAL_TABLET | Freq: Four times a day (QID) | ORAL | Status: DC | PRN
  Administered 2022-12-22: 25 mg via ORAL
  Filled 2022-12-21: qty 1

## 2022-12-21 NOTE — Plan of Care (Signed)
  Problem: Education: Goal: Ability to describe self-care measures that may prevent or decrease complications (Diabetes Survival Skills Education) will improve Outcome: Progressing   Problem: Fluid Volume: Goal: Ability to maintain a balanced intake and output will improve Outcome: Progressing   Problem: Coping: Goal: Ability to adjust to condition or change in health will improve Outcome: Progressing   Problem: Coping: Goal: Ability to adjust to condition or change in health will improve Outcome: Progressing   Problem: Fluid Volume: Goal: Ability to maintain a balanced intake and output will improve Outcome: Progressing   Problem: Fluid Volume: Goal: Ability to maintain a balanced intake and output will improve Outcome: Progressing   Problem: Coping: Goal: Ability to adjust to condition or change in health will improve Outcome: Progressing   Problem: Metabolic: Goal: Ability to maintain appropriate glucose levels will improve Outcome: Progressing   Problem: Nutritional: Goal: Maintenance of adequate nutrition will improve Outcome: Progressing   Problem: Tissue Perfusion: Goal: Adequacy of tissue perfusion will improve Outcome: Progressing   Problem: Education: Goal: Knowledge of General Education information will improve Description: Including pain rating scale, medication(s)/side effects and non-pharmacologic comfort measures Outcome: Progressing
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
## 2022-12-22 LAB — BASIC METABOLIC PANEL
Anion gap: 10 (ref 5–15)
BUN: 28 mg/dL — ABNORMAL HIGH (ref 6–20)
CO2: 27 mmol/L (ref 22–32)
Calcium: 9.1 mg/dL (ref 8.9–10.3)
Chloride: 103 mmol/L (ref 98–111)
Creatinine, Ser: 3.63 mg/dL — ABNORMAL HIGH (ref 0.61–1.24)
GFR, Estimated: 22 mL/min — ABNORMAL LOW (ref >60)
Glucose, Bld: 86 mg/dL (ref 70–99)
Potassium: 4.4 mmol/L (ref 3.5–5.1)
Sodium: 140 mmol/L (ref 135–145)

## 2022-12-22 LAB — MAGNESIUM: Magnesium: 1.5 mg/dL — ABNORMAL LOW (ref 1.7–2.4)

## 2022-12-22 MED ORDER — CARVEDILOL 6.25 MG PO TABS
6.2500 mg | ORAL_TABLET | Freq: Two times a day (BID) | ORAL | Status: AC
  Administered 2022-12-22: 6.25 mg via ORAL
  Filled 2022-12-22: qty 1

## 2022-12-22 MED ORDER — AMLODIPINE BESYLATE 10 MG PO TABS
10.0000 mg | ORAL_TABLET | Freq: Every day | ORAL | Status: DC
  Filled 2022-12-22: qty 1

## 2022-12-22 MED ORDER — ISOSORB DINITRATE-HYDRALAZINE 20-37.5 MG PO TABS: 2.0000 | ORAL_TABLET | Freq: Three times a day (TID) | ORAL | 0 refills | Status: AC

## 2022-12-22 MED ORDER — CARVEDILOL 12.5 MG PO TABS: 12.5000 mg | ORAL_TABLET | Freq: Two times a day (BID) | ORAL | 0 refills | Status: DC

## 2022-12-22 MED ORDER — LEVETIRACETAM 100 MG/ML PO SOLN: 500.0000 mg | Freq: Two times a day (BID) | ORAL | 0 refills | Status: DC

## 2022-12-22 MED ORDER — CARVEDILOL 12.5 MG PO TABS: 12.5000 mg | ORAL_TABLET | Freq: Two times a day (BID) | ORAL | Status: DC

## 2022-12-22 NOTE — Plan of Care (Signed)
  Problem: Education: Goal: Ability to describe self-care measures that may prevent or decrease complications (Diabetes Survival Skills Education) will improve Outcome: Progressing Goal: Individualized Educational Video(s) Outcome: Progressing   Problem: Coping: Goal: Ability to adjust to condition or change in health will improve Outcome: Progressing   Problem: Fluid Volume: Goal: Ability to maintain a balanced intake and output will improve Outcome: Progressing   Problem: Health Behavior/Discharge Planning: Goal: Ability to identify and utilize available resources and services will improve Outcome: Progressing Goal: Ability to manage health-related needs will improve Outcome: Progressing   Problem: Metabolic: Goal: Ability to maintain appropriate glucose levels will improve Outcome: Progressing   Problem: Nutritional: Goal: Maintenance of adequate nutrition will improve Outcome: Progressing Goal: Progress toward achieving an optimal weight will improve Outcome: Progressing   Problem: Skin Integrity: Goal: Risk for impaired skin integrity will decrease Outcome: Progressing   Problem: Tissue Perfusion: Goal: Adequacy of tissue perfusion will improve Outcome: Progressing   Problem: Education: Goal: Knowledge of General Education information will improve Description: Including pain rating scale, medication(s)/side effects and non-pharmacologic comfort measures Outcome: Progressing   Problem: Health Behavior/Discharge Planning: Goal: Ability to manage health-related needs will improve Outcome: Progressing   Problem: Clinical Measurements: Goal: Ability to maintain clinical measurements within normal limits will improve Outcome: Progressing Goal: Will remain free from infection Outcome: Progressing Goal: Diagnostic test results will improve Outcome: Progressing Goal: Respiratory complications will improve Outcome: Progressing Goal: Cardiovascular complication will  be avoided Outcome: Progressing   Problem: Activity: Goal: Risk for activity intolerance will decrease Outcome: Progressing   Problem: Nutrition: Goal: Adequate nutrition will be maintained Outcome: Progressing   Problem: Coping: Goal: Level of anxiety will decrease Outcome: Progressing   Problem: Elimination: Goal: Will not experience complications related to bowel motility Outcome: Progressing Goal: Will not experience complications related to urinary retention Outcome: Progressing   Problem: Pain Managment: Goal: General experience of comfort will improve Outcome: Progressing   Problem: Safety: Goal: Ability to remain free from injury will improve Outcome: Progressing   Problem: Skin Integrity: Goal: Risk for impaired skin integrity will decrease Outcome: Progressing   Problem: Education: Goal: Expressions of having a comfortable level of knowledge regarding the disease process will increase Outcome: Progressing   Problem: Coping: Goal: Ability to adjust to condition or change in health will improve Outcome: Progressing Goal: Ability to identify appropriate support needs will improve Outcome: Progressing   Problem: Health Behavior/Discharge Planning: Goal: Compliance with prescribed medication regimen will improve Outcome: Progressing   Problem: Medication: Goal: Risk for medication side effects will decrease Outcome: Progressing   Problem: Clinical Measurements: Goal: Complications related to the disease process, condition or treatment will be avoided or minimized Outcome: Progressing Goal: Diagnostic test results will improve Outcome: Progressing   Problem: Safety: Goal: Verbalization of understanding the information provided will improve Outcome: Progressing   Problem: Self-Concept: Goal: Level of anxiety will decrease Outcome: Progressing Goal: Ability to verbalize feelings about condition will improve Outcome: Progressing   Problem:  Safety: Goal: Violent Restraint(s) Outcome: Completed/Met   Problem: Safety: Goal: Non-violent Restraint(s) Outcome: Completed/Met
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
## 2022-12-22 NOTE — Progress Notes (Signed)
Patient BP 210/133 on Dinamap when NT tok vitals, informed MD, rechecked BP manually, it was still elevated at 170/90. Tried to give patient morning medications, refused Amlodipine, informed MD, patient asked if MD could call and explain new medication to him. MD called and had at length discussion education patient on new medication and elevated BP risks. Patient requested to leave AMA, Re-educated and reiterated what MD had said about increased risk for worsening condition, disability, and even death as well as patient financial responsibility for hospital stay. Patient still requested AMA form.

## 2023-01-03 DIAGNOSIS — I10 Essential (primary) hypertension: Secondary | ICD-10-CM | POA: Diagnosis not present

## 2023-01-03 DIAGNOSIS — Z136 Encounter for screening for cardiovascular disorders: Secondary | ICD-10-CM | POA: Diagnosis not present

## 2023-01-03 DIAGNOSIS — N179 Acute kidney failure, unspecified: Secondary | ICD-10-CM | POA: Diagnosis not present

## 2023-01-03 DIAGNOSIS — R561 Post traumatic seizures: Secondary | ICD-10-CM | POA: Diagnosis not present

## 2023-01-03 DIAGNOSIS — Z0001 Encounter for general adult medical examination with abnormal findings: Secondary | ICD-10-CM | POA: Diagnosis not present

## 2023-01-03 DIAGNOSIS — Z1329 Encounter for screening for other suspected endocrine disorder: Secondary | ICD-10-CM | POA: Diagnosis not present

## 2023-01-03 DIAGNOSIS — Z131 Encounter for screening for diabetes mellitus: Secondary | ICD-10-CM | POA: Diagnosis not present

## 2023-01-07 ENCOUNTER — Other Ambulatory Visit: Payer: Self-pay

## 2023-01-07 ENCOUNTER — Emergency Department (HOSPITAL_COMMUNITY): Payer: Medicaid Other

## 2023-01-07 ENCOUNTER — Encounter (HOSPITAL_COMMUNITY): Payer: Self-pay | Admitting: Emergency Medicine

## 2023-01-07 ENCOUNTER — Emergency Department (HOSPITAL_COMMUNITY)
Admission: EM | Admit: 2023-01-07 | Discharge: 2023-01-08 | Discharge status: Against Medical Advice | Payer: Medicaid Other | Attending: Emergency Medicine | Admitting: Emergency Medicine

## 2023-01-07 DIAGNOSIS — G936 Cerebral edema: Secondary | ICD-10-CM | POA: Diagnosis not present

## 2023-01-07 DIAGNOSIS — R0789 Other chest pain: Secondary | ICD-10-CM | POA: Insufficient documentation

## 2023-01-07 DIAGNOSIS — Y92410 Unspecified street and highway as the place of occurrence of the external cause: Secondary | ICD-10-CM | POA: Diagnosis not present

## 2023-01-07 DIAGNOSIS — R519 Headache, unspecified: Secondary | ICD-10-CM | POA: Insufficient documentation

## 2023-01-07 DIAGNOSIS — Z5321 Procedure and treatment not carried out due to patient leaving prior to being seen by health care provider: Secondary | ICD-10-CM | POA: Insufficient documentation

## 2023-01-07 DIAGNOSIS — G9389 Other specified disorders of brain: Secondary | ICD-10-CM | POA: Diagnosis not present

## 2023-01-07 DIAGNOSIS — S0990XA Unspecified injury of head, initial encounter: Secondary | ICD-10-CM | POA: Diagnosis not present
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
## 2023-01-08 NOTE — ED Notes (Signed)
Pt gave identifiable stickers to registration and stated they were leaving.

## 2023-01-11 DIAGNOSIS — G40901 Epilepsy, unspecified, not intractable, with status epilepticus: Secondary | ICD-10-CM | POA: Diagnosis not present

## 2023-01-11 DIAGNOSIS — I129 Hypertensive chronic kidney disease with stage 1 through stage 4 chronic kidney disease, or unspecified chronic kidney disease: Secondary | ICD-10-CM | POA: Diagnosis not present

## 2023-01-11 DIAGNOSIS — N189 Chronic kidney disease, unspecified: Secondary | ICD-10-CM | POA: Diagnosis not present

## 2023-01-11 DIAGNOSIS — N179 Acute kidney failure, unspecified: Secondary | ICD-10-CM | POA: Diagnosis not present

## 2023-01-11 DIAGNOSIS — M6282 Rhabdomyolysis: Secondary | ICD-10-CM | POA: Diagnosis not present

## 2023-01-29 ENCOUNTER — Ambulatory Visit: Payer: Medicaid Other | Admitting: Neurology

## 2023-01-29 DIAGNOSIS — Z136 Encounter for screening for cardiovascular disorders: Secondary | ICD-10-CM | POA: Diagnosis not present

## 2023-01-29 DIAGNOSIS — Z1329 Encounter for screening for other suspected endocrine disorder: Secondary | ICD-10-CM | POA: Diagnosis not present

## 2023-01-29 DIAGNOSIS — Z0001 Encounter for general adult medical examination with abnormal findings: Secondary | ICD-10-CM | POA: Diagnosis not present

## 2023-01-29 DIAGNOSIS — Z131 Encounter for screening for diabetes mellitus: Secondary | ICD-10-CM | POA: Diagnosis not present

## 2023-01-29 DIAGNOSIS — I10 Essential (primary) hypertension: Secondary | ICD-10-CM | POA: Diagnosis not present

## 2023-01-29 DIAGNOSIS — R561 Post traumatic seizures: Secondary | ICD-10-CM | POA: Diagnosis not present

## 2023-02-15 ENCOUNTER — Encounter: Payer: Self-pay | Admitting: Neurology

## 2023-02-15 ENCOUNTER — Ambulatory Visit (INDEPENDENT_AMBULATORY_CARE_PROVIDER_SITE_OTHER): Payer: Medicaid Other | Admitting: Neurology

## 2023-02-15 VITALS — BP 164/110 | HR 89 | Ht 78.0 in | Wt 298.0 lb

## 2023-02-15 DIAGNOSIS — Z5181 Encounter for therapeutic drug level monitoring: Secondary | ICD-10-CM

## 2023-02-15 DIAGNOSIS — G40909 Epilepsy, unspecified, not intractable, without status epilepticus: Secondary | ICD-10-CM

## 2023-02-15 DIAGNOSIS — W3400XA Accidental discharge from unspecified firearms or gun, initial encounter: Secondary | ICD-10-CM

## 2023-02-15 MED ORDER — LEVETIRACETAM 500 MG PO TABS: 500.0000 mg | ORAL_TABLET | Freq: Two times a day (BID) | ORAL | 3 refills | Status: AC

## 2023-02-15 MED ORDER — CARVEDILOL 12.5 MG PO TABS: 12.5000 mg | ORAL_TABLET | Freq: Two times a day (BID) | ORAL | 1 refills | Status: AC

## 2023-02-15 NOTE — Patient Instructions (Signed)
Continue with Keppra 500 mg twice daily, refill given Continue other medication including Coreg Follow-up with PCP Return in 1 year or sooner if worse
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
## 2023-02-16 LAB — LEVETIRACETAM LEVEL: Levetiracetam Lvl: 8.9 ug/mL — ABNORMAL LOW (ref 10.0–40.0)

## 2023-06-06 DIAGNOSIS — R369 Urethral discharge, unspecified: Secondary | ICD-10-CM | POA: Diagnosis not present

## 2024-08-27 DIAGNOSIS — Z113 Encounter for screening for infections with a predominantly sexual mode of transmission: Secondary | ICD-10-CM | POA: Diagnosis not present

## 2024-08-27 DIAGNOSIS — Z202 Contact with and (suspected) exposure to infections with a predominantly sexual mode of transmission: Secondary | ICD-10-CM | POA: Diagnosis not present
